# Patient Record
Sex: Female | Born: 1990 | Race: White | Hispanic: No | Marital: Married | Smoking: Former smoker
Health system: Southern US, Community
[De-identification: ages and names within clinical notes are randomized; demographics above are authoritative.]

## PROBLEM LIST (undated history)

## (undated) ENCOUNTER — Inpatient Hospital Stay: Payer: Self-pay

## (undated) DIAGNOSIS — F32A Depression, unspecified: Secondary | ICD-10-CM

## (undated) DIAGNOSIS — F329 Major depressive disorder, single episode, unspecified: Secondary | ICD-10-CM

## (undated) DIAGNOSIS — O24419 Gestational diabetes mellitus in pregnancy, unspecified control: Secondary | ICD-10-CM

## (undated) HISTORY — DX: Depression, unspecified: F32.A

## (undated) HISTORY — PX: FOOT SURGERY: SHX648

## (undated) HISTORY — DX: Gestational diabetes mellitus in pregnancy, unspecified control: O24.419

## (undated) HISTORY — DX: Major depressive disorder, single episode, unspecified: F32.9

---
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
## 2012-06-28 ENCOUNTER — Ambulatory Visit: Payer: Self-pay | Admitting: Internal Medicine

## 2013-02-16 ENCOUNTER — Emergency Department: Payer: Self-pay | Admitting: Emergency Medicine

## 2013-02-16 LAB — COMPREHENSIVE METABOLIC PANEL
Alkaline Phosphatase: 81 U/L (ref 50–136)
Anion Gap: 3 — ABNORMAL LOW (ref 7–16)
BUN: 16 mg/dL (ref 7–18)
Bilirubin,Total: 0.6 mg/dL (ref 0.2–1.0)
Co2: 26 mmol/L (ref 21–32)
Creatinine: 0.95 mg/dL (ref 0.60–1.30)
EGFR (African American): >60
EGFR (Non-African Amer.): >60
Glucose: 71 mg/dL (ref 65–99)
SGPT (ALT): 22 U/L (ref 12–78)
Sodium: 135 mmol/L — ABNORMAL LOW (ref 136–145)
Total Protein: 8.2 g/dL (ref 6.4–8.2)

## 2013-02-16 LAB — URINALYSIS, COMPLETE
Bacteria: NONE SEEN
Bilirubin,UR: NEGATIVE
Glucose,UR: NEGATIVE mg/dL (ref 0–75)
Ph: 5 (ref 4.5–8.0)
Protein: NEGATIVE
RBC,UR: <1 /HPF (ref 0–5)
Squamous Epithelial: 1
WBC UR: <1 /HPF (ref 0–5)

## 2013-02-16 LAB — CBC WITH DIFFERENTIAL/PLATELET
Basophil #: 0.1 10*3/uL (ref 0.0–0.1)
HGB: 12.9 g/dL (ref 12.0–16.0)
Lymphocyte #: 1.7 10*3/uL (ref 1.0–3.6)
Lymphocyte %: 27.3 %
MCH: 26.9 pg (ref 26.0–34.0)
MCV: 83 fL (ref 80–100)
Neutrophil %: 63.2 %
Platelet: 236 10*3/uL (ref 150–440)
RBC: 4.8 10*6/uL (ref 3.80–5.20)
WBC: 6.2 10*3/uL (ref 3.6–11.0)

## 2013-02-16 LAB — HCG, QUANTITATIVE, PREGNANCY: Beta Hcg, Quant.: 526 m[IU]/mL — ABNORMAL HIGH

## 2013-08-04 ENCOUNTER — Observation Stay: Payer: Self-pay | Admitting: Obstetrics and Gynecology

## 2013-08-04 LAB — URINALYSIS, COMPLETE
BLOOD: NEGATIVE
Bilirubin,UR: NEGATIVE
GLUCOSE, UR: NEGATIVE mg/dL (ref 0–75)
Leukocyte Esterase: NEGATIVE
NITRITE: NEGATIVE
PROTEIN: NEGATIVE
Ph: 5 (ref 4.5–8.0)
Specific Gravity: 1.01 (ref 1.003–1.030)
Squamous Epithelial: <1

## 2013-08-04 LAB — FETAL FIBRONECTIN
Appearance: NORMAL
FETAL FIBRONECTIN: NEGATIVE

## 2013-08-04 LAB — RUPTURE OF MEMBRANE PLUS: ROM PLUS: NOT DETECTED

## 2013-09-16 ENCOUNTER — Ambulatory Visit: Payer: Self-pay | Admitting: Obstetrics and Gynecology

## 2013-09-28 ENCOUNTER — Ambulatory Visit: Payer: Self-pay | Admitting: Obstetrics and Gynecology

## 2013-10-11 ENCOUNTER — Observation Stay: Payer: Self-pay

## 2013-10-17 ENCOUNTER — Observation Stay: Payer: Self-pay | Admitting: Obstetrics and Gynecology

## 2013-10-17 LAB — PIH PROFILE
AST: 19 U/L (ref 15–37)
Anion Gap: 7 (ref 7–16)
BUN: 6 mg/dL — ABNORMAL LOW (ref 7–18)
CALCIUM: 8.7 mg/dL (ref 8.5–10.1)
Chloride: 107 mmol/L (ref 98–107)
Co2: 25 mmol/L (ref 21–32)
Creatinine: 0.83 mg/dL (ref 0.60–1.30)
EGFR (African American): >60
EGFR (Non-African Amer.): >60
GLUCOSE: 65 mg/dL (ref 65–99)
HCT: 36 % (ref 35.0–47.0)
HGB: 11.7 g/dL — ABNORMAL LOW (ref 12.0–16.0)
MCH: 28.5 pg (ref 26.0–34.0)
MCHC: 32.4 g/dL (ref 32.0–36.0)
MCV: 88 fL (ref 80–100)
OSMOLALITY: 273 (ref 275–301)
Platelet: 227 10*3/uL (ref 150–440)
Potassium: 4.1 mmol/L (ref 3.5–5.1)
RBC: 4.08 10*6/uL (ref 3.80–5.20)
RDW: 13 % (ref 11.5–14.5)
SODIUM: 139 mmol/L (ref 136–145)
Uric Acid: 5.3 mg/dL (ref 2.6–6.0)
WBC: 8.6 10*3/uL (ref 3.6–11.0)

## 2013-10-17 LAB — PROTEIN / CREATININE RATIO, URINE
CREATININE, URINE: 74.6 mg/dL (ref 30.0–125.0)
PROTEIN, RANDOM URINE: 7 mg/dL (ref 0–12)
PROTEIN/CREAT. RATIO: 94 mg/g{creat} (ref 0–200)

## 2013-10-25 ENCOUNTER — Inpatient Hospital Stay: Payer: Self-pay

## 2013-10-25 LAB — CBC WITH DIFFERENTIAL/PLATELET
Basophil #: 0 10*3/uL (ref 0.0–0.1)
Basophil %: 0.3 %
Eosinophil #: 0 10*3/uL (ref 0.0–0.7)
Eosinophil %: 0.5 %
HCT: 34.3 % — ABNORMAL LOW (ref 35.0–47.0)
HGB: 11.5 g/dL — ABNORMAL LOW (ref 12.0–16.0)
LYMPHS PCT: 13.1 %
Lymphocyte #: 1.1 10*3/uL (ref 1.0–3.6)
MCH: 29.1 pg (ref 26.0–34.0)
MCHC: 33.6 g/dL (ref 32.0–36.0)
MCV: 86 fL (ref 80–100)
Monocyte #: 0.6 x10 3/mm (ref 0.2–0.9)
Monocyte %: 6.9 %
NEUTROS PCT: 79.2 %
Neutrophil #: 6.6 10*3/uL — ABNORMAL HIGH (ref 1.4–6.5)
Platelet: 216 10*3/uL (ref 150–440)
RBC: 3.96 10*6/uL (ref 3.80–5.20)
RDW: 13.2 % (ref 11.5–14.5)
WBC: 8.3 10*3/uL (ref 3.6–11.0)

## 2013-10-26 LAB — GC/CHLAMYDIA PROBE AMP

## 2013-10-27 LAB — HEMATOCRIT: HCT: 26.1 % — ABNORMAL LOW (ref 35.0–47.0)

## 2014-05-04 LAB — HM PAP SMEAR: HM Pap smear: NEGATIVE

## 2014-08-08 NOTE — H&P (Signed)
L&D Evaluation:  History:  HPI 24yo SWF sent over from office for IOL at 40w 0d, G2P010 pregnancy complicated by PTL at 28 weeks, and GDM diet controlled.   Presents with contractions   Patient's Medical History No Chronic Illness   Patient's Surgical History none   Medications Pre Natal Vitamins  Tylenol (Acetaminophen)   Allergies NKDA   Social History none   Family History Non-Contributory   ROS:  ROS All systems were reviewed.  HEENT, CNS, GI, GU, Respiratory, CV, Renal and Musculoskeletal systems were found to be normal.   Exam:  Vital Signs stable   Urine Protein negative dipstick   General no apparent distress   Mental Status clear   Chest clear   Heart normal sinus rhythm   Abdomen gravid, non-tender   Estimated Fetal Weight Average for gestational age   Fetal Position vtx   Pelvic 4/80/-1   Mebranes Ruptured, AROM now   Description clear   FHT normal rate with no decels   Fetal Heart Rate 126   Ucx irregular   Ucx Frequency 10 min   Length of each Contraction 50 seconds   Ucx Pain Scale 2   Impression:  Impression IOL secondary to GDM   Plan:  Plan AROM, possible pitocin augmentation; epidural   Electronic Signatures: Yolanda BonineBurr, Hadasa Gasner N (CNM)  (Signed 28-Jul-15 18:00)  Authored: L&D Evaluation   Last Updated: 28-Jul-15 18:00 by Ulyses AmorBurr, Cote Mayabb N (CNM)

## 2014-08-08 NOTE — H&P (Signed)
L&D Evaluation:  History Expanded:  HPI 24 yo G2P0010 at 28 weeks by LMP verified by early US, who presents with decreased fetal movement has not felt baby since yesterday and has leaking of fluid and a discharge that she hs\as called the offie about and they told her it was probably urine. she swears she is not peeing on herself so she comes in today to be evaluated. She had a SCH early on FOB of the baby has a brother wth Trisomy 4 and 14 and mental retardationa nd cleft palate, SHE is O neg/VI/RNI. She got rhogam at her early visit due to bleeding.   Gravida 2   Term 0   PreTerm 0   Abortion 1   Living o   Blood Type (Maternal) O negative   Group B Strep Results Maternal (Result >5wks must be treated as unknown) unknown/result > 5 weeks ago   Maternal HIV Negative   Maternal Syphilis Ab Nonreactive   Maternal Varicella Immune   Rubella Results (Maternal) nonimmune   Maternal [facility]Dap [facility]   Presents with decreased fetal movement, leaking fluid   Patient's Medical History No Chronic Illness    Patient's Surgical History none    Medications Pre Natal Vitamins    Allergies NKDA   Social History none    Family History see hpi    ROS:  ROS All systems were reviewed.  HEENT, CNS, GI, GU, Respiratory, CV, Renal and Musculoskeletal systems were found to be normal.   Exam:  Vital Signs stable    Urine Protein trace ketones and ph 5.0   General no apparent distress   Mental Status clear    Chest clear    Heart normal sinus rhythm   Abdomen gravid, non-tender   Back no CVAT   Edema no edema    Reflexes 1+    Pelvic no external lesions, posterior/soft shortened feeling FT, fetal head descended to LUS   Mebranes Intact   FHT normal rate with no decels, reassuring for 28 weeks.   Ucx has some runs of them irregular not painful   Skin dry   Lymph no lymphadenopathy    Impression:  Impression dehydration, decreased fetal  movement, vaginal dishcarge BV   Plan:  Plan UA, EFM/NST, monitor contractions and for cervical change, wety prep   Comments wet prep done BV loads of gardnerella will treat with flagyl 500mg  po bid   Follow Up Appointment in 1 week. alsom schedule US to check cervical length on fri at 11am at [facility],.   Electronic Signatures: Adria DevonKlett, Shahzad Thomann (MD)  (Signed 07-May-15 16:59)  Authored: L&D Evaluation   Last Updated: 07-May-15 16:59 by Adria DevonKlett, Alis Sawchuk (MD)

## 2015-05-07 ENCOUNTER — Encounter: Payer: Self-pay | Admitting: *Deleted

## 2015-05-10 ENCOUNTER — Encounter: Payer: Self-pay | Admitting: Obstetrics and Gynecology

## 2015-05-23 ENCOUNTER — Encounter: Payer: Self-pay | Admitting: Obstetrics and Gynecology

## 2015-05-23 ENCOUNTER — Ambulatory Visit (INDEPENDENT_AMBULATORY_CARE_PROVIDER_SITE_OTHER): Payer: BC Managed Care – PPO | Admitting: Obstetrics and Gynecology

## 2015-05-23 VITALS — BP 103/81 | HR 80 | Ht 68.0 in | Wt 147.1 lb

## 2015-05-23 DIAGNOSIS — N921 Excessive and frequent menstruation with irregular cycle: Secondary | ICD-10-CM | POA: Diagnosis not present

## 2015-05-23 DIAGNOSIS — Z975 Presence of (intrauterine) contraceptive device: Secondary | ICD-10-CM

## 2015-05-23 DIAGNOSIS — R4586 Emotional lability: Secondary | ICD-10-CM

## 2015-05-23 DIAGNOSIS — Z3046 Encounter for surveillance of implantable subdermal contraceptive: Secondary | ICD-10-CM | POA: Diagnosis not present

## 2015-05-23 DIAGNOSIS — F39 Unspecified mood [affective] disorder: Secondary | ICD-10-CM | POA: Diagnosis not present

## 2015-05-23 NOTE — Progress Notes (Signed)
Jasmine Edwards is a 25 y.o. year old Caucasian female here for Implanon removal.  Patient given informed consent for removal of her Implanon DUE TO IRREGULAR BLEEDING, WEIGHT GAIN AND INCREASED MOOD SWINGS.  BP 103/81 mmHg  Pulse 80  Ht  (1.727 m)  Wt 147 lb 1.6 oz (66.724 kg)  BMI 22.37 kg/m2  LMP 01/30/2015  Breastfeeding? No  Appropriate time out taken. Implanon site identified.  Area prepped in usual sterile fashon. One cc of 2% lidocaine was used to anesthetize the area at the distal end of the implant. A small stab incision was made right beside the implant on the distal portion.  The Implanon rod was grasped using hemostats and removed without difficulty.  There was less than 3 cc blood loss. There were no complications.  Steri-strips were applied over the small incision and a pressure bandage was applied.  The patient tolerated the procedure well.  She was instructed to keep the area clean and dry, remove pressure bandage in 24 hours, and keep insertion site covered with the steri-strip for 3-5 days.    Will consider Paragard-info given to review.  Follow-up PRN problems.  Hugh Garrow N Ignacio Lowder,CNM

## 2015-06-20 ENCOUNTER — Encounter: Payer: Self-pay | Admitting: Obstetrics and Gynecology

## 2015-08-01 ENCOUNTER — Encounter: Payer: Self-pay | Admitting: Obstetrics and Gynecology

## 2016-08-08 ENCOUNTER — Other Ambulatory Visit: Payer: Self-pay | Admitting: Obstetrics and Gynecology

## 2016-08-08 ENCOUNTER — Encounter: Payer: Self-pay | Admitting: Obstetrics and Gynecology

## 2016-08-08 ENCOUNTER — Ambulatory Visit (INDEPENDENT_AMBULATORY_CARE_PROVIDER_SITE_OTHER): Payer: BC Managed Care – PPO | Admitting: Obstetrics and Gynecology

## 2016-08-08 VITALS — BP 106/65 | HR 62 | Ht 67.0 in | Wt 140.1 lb

## 2016-08-08 DIAGNOSIS — Z01419 Encounter for gynecological examination (general) (routine) without abnormal findings: Secondary | ICD-10-CM

## 2016-08-08 MED ORDER — FLUOXETINE HCL 10 MG PO CAPS: 10.0000 mg | ORAL_CAPSULE | Freq: Every day | ORAL | 3 refills | Status: DC

## 2016-08-08 NOTE — Patient Instructions (Signed)
Generalized Anxiety Disorder, Adult Generalized anxiety disorder (GAD) is a mental health disorder. People with this condition constantly worry about everyday events. Unlike normal anxiety, worry related to GAD is not triggered by a specific event. These worries also do not fade or get better with time. GAD interferes with life functions, including relationships, work, and school. GAD can vary from mild to severe. People with severe GAD can have intense waves of anxiety with physical symptoms (panic attacks). What are the causes? The exact cause of GAD is not known. What increases the risk? This condition is more likely to develop in:  Women.  People who have a family history of anxiety disorders.  People who are very shy.  People who experience very stressful life events, such as the death of a loved one.  People who have a very stressful family environment. What are the signs or symptoms? People with GAD often worry excessively about many things in their lives, such as their health and family. They may also be overly concerned about:  Doing well at work.  Being on time.  Natural disasters.  Friendships. Physical symptoms of GAD include:  Fatigue.  Muscle tension or having muscle twitches.  Trembling or feeling shaky.  Being easily startled.  Feeling like your heart is pounding or racing.  Feeling out of breath or like you cannot take a deep breath.  Having trouble falling asleep or staying asleep.  Sweating.  Nausea, diarrhea, or irritable bowel syndrome (IBS).  Headaches.  Trouble concentrating or remembering facts.  Restlessness.  Irritability. How is this diagnosed? Your health care provider can diagnose GAD based on your symptoms and medical history. You will also have a physical exam. The health care provider will ask specific questions about your symptoms, including how severe they are, when they started, and if they come and go. Your health care  provider may ask you about your use of alcohol or drugs, including prescription medicines. Your health care provider may refer you to a mental health specialist for further evaluation. Your health care provider will do a thorough examination and may perform additional tests to rule out other possible causes of your symptoms. To be diagnosed with GAD, a person must have anxiety that:  Is out of his or her control.  Affects several different aspects of his or her life, such as work and relationships.  Causes distress that makes him or her unable to take part in normal activities.  Includes at least three physical symptoms of GAD, such as restlessness, fatigue, trouble concentrating, irritability, muscle tension, or sleep problems. Before your health care provider can confirm a diagnosis of GAD, these symptoms must be present more days than they are not, and they must last for six months or longer. How is this treated? The following therapies are usually used to treat GAD:  Medicine. Antidepressant medicine is usually prescribed for long-term daily control. Antianxiety medicines may be added in severe cases, especially when panic attacks occur.  Talk therapy (psychotherapy). Certain types of talk therapy can be helpful in treating GAD by providing support, education, and guidance. Options include:  Cognitive behavioral therapy (CBT). People learn coping skills and techniques to ease their anxiety. They learn to identify unrealistic or negative thoughts and behaviors and to replace them with positive ones.  Acceptance and commitment therapy (ACT). This treatment teaches people how to be mindful as a way to cope with unwanted thoughts and feelings.  Biofeedback. This process trains you to manage your body's response (  physiological response) through breathing techniques and relaxation methods. You will work with a therapist while machines are used to monitor your physical symptoms.  Stress  management techniques. These include yoga, meditation, and exercise. A mental health specialist can help determine which treatment is best for you. Some people see improvement with one type of therapy. However, other people require a combination of therapies. Follow these instructions at home:  Take over-the-counter and prescription medicines only as told by your health care provider.  Try to maintain a normal routine.  Try to anticipate stressful situations and allow extra time to manage them.  Practice any stress management or self-calming techniques as taught by your health care provider.  Do not punish yourself for setbacks or for not making progress.  Try to recognize your accomplishments, even if they are small.  Keep all follow-up visits as told by your health care provider. This is important. Contact a health care provider if:  Your symptoms do not get better.  Your symptoms get worse.  You have signs of depression, such as:  A persistently sad, cranky, or irritable mood.  Loss of enjoyment in activities that used to bring you joy.  Change in weight or eating.  Changes in sleeping habits.  Avoiding friends or family members.  Loss of energy for normal tasks.  Feelings of guilt or worthlessness. Get help right away if:  You have serious thoughts about hurting yourself or others. If you ever feel like you may hurt yourself or others, or have thoughts about taking your own life, get help right away. You can go to your nearest emergency department or call:  Your local emergency services (911 in the [place]  A suicide crisis helpline, such as the National Suicide Prevention Lifeline at 1-800-273-8255. This is open 24 hours a day. Summary  Generalized anxiety disorder (GAD) is a mental health disorder that involves worry that is not triggered by a specific event.  People with GAD often worry excessively about many things in their lives, such as their health and  family.  GAD may cause physical symptoms such as restlessness, trouble concentrating, sleep problems, frequent sweating, nausea, diarrhea, headaches, and trembling or muscle twitching.  A mental health specialist can help determine which treatment is best for you. Some people see improvement with one type of therapy. However, other people require a combination of therapies. This information is not intended to replace advice given to you by your health care provider. Make sure you discuss any questions you have with your health care provider. Document Released: 07/12/2012 Document Revised: 02/05/2016 Document Reviewed: 02/05/2016 Elsevier Interactive Patient Education  2017 [employer]  

## 2016-08-08 NOTE — Progress Notes (Signed)
   Subjective:     Jasmine Edwards is a 26 y.o. female single caucasion, [place] and is here for a comprehensive physical exam. The patient reports problems - anxiety worse since stopping paxil and desires a different medication..  Social History   Social History  . Marital status: Single    Spouse name: N/A  . Number of children: N/A  . Years of education: N/A   Occupational History  . Not on file.   Social History Main Topics  . Smoking status: Former Games developermoker  . Smokeless tobacco: Never Used  . Alcohol use No  . Drug use: No  . Sexual activity: Yes    Birth control/ protection: Implant   Other Topics Concern  . Not on file   Social History Narrative  . No narrative on file   Health Maintenance  Topic Date Due  . HIV Screening  10/07/2007  . TETANUS/TDAP  10/07/2011  . INFLUENZA VACCINE  10/29/2016  . PAP SMEAR  05/04/2017    The following portions of the patient's history were reviewed and updated as appropriate: allergies, current medications, past family history, past medical history, past social history, past surgical history and problem list.  Review of Systems Pertinent items noted in HPI and remainder of comprehensive ROS otherwise negative.   Objective:    General appearance: alert, cooperative and appears stated age Neck: no adenopathy, no carotid bruit, no JVD, supple, symmetrical, trachea midline and thyroid not enlarged, symmetric, no tenderness/mass/nodules Lungs: clear to auscultation bilaterally Breasts: normal appearance, no masses or tenderness Heart: regular rate and rhythm, S1, S2 normal, no murmur, click, rub or gallop Abdomen: soft, non-tender; bowel sounds normal; no masses,  no organomegaly Pelvic: cervix normal in appearance, external genitalia normal, no adnexal masses or tenderness, no cervical motion tenderness, rectovaginal septum normal, uterus normal size, shape, and consistency and vagina normal without  discharge    Assessment:    Healthy female exam. anxiety     Plan:  Will try prozac 10mg  daily, patient to message me on MyChart in 5-6 weeks and let me know if medicine is working.  RTC 1 year or as needed.  Melody Aura CampsShambley, CNM   See After Visit Summary for Counseling Recommendations

## 2016-08-11 LAB — CYTOLOGY - PAP

## 2016-08-13 ENCOUNTER — Other Ambulatory Visit: Payer: Self-pay | Admitting: Obstetrics and Gynecology

## 2016-08-13 DIAGNOSIS — A749 Chlamydial infection, unspecified: Secondary | ICD-10-CM

## 2016-08-13 DIAGNOSIS — A568 Sexually transmitted chlamydial infection of other sites: Secondary | ICD-10-CM | POA: Insufficient documentation

## 2016-08-13 DIAGNOSIS — O98311 Other infections with a predominantly sexual mode of transmission complicating pregnancy, first trimester: Secondary | ICD-10-CM | POA: Insufficient documentation

## 2016-08-13 MED ORDER — AZITHROMYCIN 500 MG PO TABS: 1000.0000 mg | ORAL_TABLET | Freq: Once | ORAL | 1 refills | Status: AC

## 2016-08-15 ENCOUNTER — Encounter: Payer: Self-pay | Admitting: Obstetrics and Gynecology

## 2016-08-15 ENCOUNTER — Telehealth: Payer: Self-pay | Admitting: Obstetrics and Gynecology

## 2016-08-19 NOTE — Telephone Encounter (Signed)
Spoke with pt

## 2016-08-19 NOTE — Telephone Encounter (Signed)
-----   Message from Purcell NailsMelody N Shambley, [place] sent at 08/13/2016  5:04 PM EDT ----- Please let her know pap was negative except for chlamydia. I have sent in a prescription for one time dose of antibiotics. Her partner needs to be treated and I want her to come in a month [facility]

## 2016-08-21 ENCOUNTER — Encounter: Payer: Self-pay | Admitting: Obstetrics and Gynecology

## 2016-08-21 ENCOUNTER — Ambulatory Visit (INDEPENDENT_AMBULATORY_CARE_PROVIDER_SITE_OTHER): Payer: BC Managed Care – PPO | Admitting: Obstetrics and Gynecology

## 2016-08-21 VITALS — BP 124/83 | HR 83 | Ht 67.0 in | Wt 139.1 lb

## 2016-08-21 DIAGNOSIS — N926 Irregular menstruation, unspecified: Secondary | ICD-10-CM | POA: Diagnosis not present

## 2016-08-21 LAB — POCT URINE PREGNANCY: Preg Test, Ur: POSITIVE — AB

## 2016-08-21 NOTE — Patient Instructions (Signed)
First Trimester of Pregnancy The first trimester of pregnancy is from week 1 until the end of week 13 (months 1 through 3). A week after a sperm fertilizes an egg, the egg will implant on the wall of the uterus. This embryo will begin to develop into a baby. Genes from you and your partner will form the baby. The female genes will determine whether the baby will be a boy or a girl. At 6-8 weeks, the eyes and face will be formed, and the heartbeat can be seen on ultrasound. At the end of 12 weeks, all the baby's organs will be formed. Now that you are pregnant, you will want to do everything you can to have a healthy baby. Two of the most important things are to get good prenatal care and to follow your health care provider's instructions. Prenatal care is all the medical care you receive before the baby's birth. This care will help prevent, find, and treat any problems during the pregnancy and childbirth. Body changes during your first trimester Your body goes through many changes during pregnancy. The changes vary from woman to woman.  You may gain or lose a couple of pounds at first.  You may feel sick to your stomach (nauseous) and you may throw up (vomit). If the vomiting is uncontrollable, call your health care provider.  You may tire easily.  You may develop headaches that can be relieved by medicines. All medicines should be approved by your health care provider.  You may urinate more often. Painful urination may mean you have a bladder infection.  You may develop heartburn as a result of your pregnancy.  You may develop constipation because certain hormones are causing the muscles that push stool through your intestines to slow down.  You may develop hemorrhoids or swollen veins (varicose veins).  Your breasts may begin to grow larger and become tender. Your nipples may stick out more, and the tissue that surrounds them (areola) may become darker.  Your gums may bleed and may be  sensitive to brushing and flossing.  Dark spots or blotches (chloasma, mask of pregnancy) may develop on your face. This will likely fade after the baby is born.  Your menstrual periods will stop.  You may have a loss of appetite.  You may develop cravings for certain kinds of food.  You may have changes in your emotions from day to day, such as being excited to be pregnant or being concerned that something may go wrong with the pregnancy and baby.  You may have more vivid and strange dreams.  You may have changes in your hair. These can include thickening of your hair, rapid growth, and changes in texture. Some women also have hair loss during or after pregnancy, or hair that feels dry or thin. Your hair will most likely return to normal after your baby is born.  What to expect at prenatal visits During a routine prenatal visit:  You will be weighed to make sure you and the baby are growing normally.  Your blood pressure will be taken.  Your abdomen will be measured to track your baby's growth.  The fetal heartbeat will be listened to between weeks 10 and 14 of your pregnancy.  Test results from any previous visits will be discussed.  Your health care provider may ask you:  How you are feeling.  If you are feeling the baby move.  If you have had any abnormal symptoms, such as leaking fluid, bleeding, severe headaches,   or abdominal cramping.  If you are using any tobacco products, including cigarettes, chewing tobacco, and electronic cigarettes.  If you have any questions.  Other tests that may be performed during your first trimester include:  Blood tests to find your blood type and to check for the presence of any previous infections. The tests will also be used to check for low iron levels (anemia) and protein on red blood cells (Rh antibodies). Depending on your risk factors, or if you previously had diabetes during pregnancy, you may have tests to check for high blood  sugar that affects pregnant women (gestational diabetes).  Urine tests to check for infections, diabetes, or protein in the urine.  An ultrasound to confirm the proper growth and development of the baby.  Fetal screens for spinal cord problems (spina bifida) and Down syndrome.  HIV (human immunodeficiency virus) testing. Routine prenatal testing includes screening for HIV, unless you choose not to have this test.  You may need other tests to make sure you and the baby are doing well.  Follow these instructions at home: Medicines  Follow your health care provider's instructions regarding medicine use. Specific medicines may be either safe or unsafe to take during pregnancy.  Take a prenatal vitamin that contains at least 600 micrograms (mcg) of folic acid.  If you develop constipation, try taking a stool softener if your health care provider approves. Eating and drinking  Eat a balanced diet that includes fresh fruits and vegetables, whole grains, good sources of protein such as meat, eggs, or tofu, and low-fat dairy. Your health care provider will help you determine the amount of weight gain that is right for you.  Avoid raw meat and uncooked cheese. These carry germs that can cause birth defects in the baby.  Eating four or five small meals rather than three large meals a day may help relieve nausea and vomiting. If you start to feel nauseous, eating a few soda crackers can be helpful. Drinking liquids between meals, instead of during meals, also seems to help ease nausea and vomiting.  Limit foods that are high in fat and processed sugars, such as fried and sweet foods.  To prevent constipation: ? Eat foods that are high in fiber, such as fresh fruits and vegetables, whole grains, and beans. ? Drink enough fluid to keep your urine clear or pale yellow. Activity  Exercise only as directed by your health care provider. Most women can continue their usual exercise routine during  pregnancy. Try to exercise for 30 minutes at least 5 days a week. Exercising will help you: ? Control your weight. ? Stay in shape. ? Be prepared for labor and delivery.  Experiencing pain or cramping in the lower abdomen or lower back is a good sign that you should stop exercising. Check with your health care provider before continuing with normal exercises.  Try to avoid standing for long periods of time. Move your legs often if you must stand in one place for a long time.  Avoid heavy lifting.  Wear low-heeled shoes and practice good posture.  You may continue to have sex unless your health care provider tells you not to. Relieving pain and discomfort  Wear a good support bra to relieve breast tenderness.  Take warm sitz baths to soothe any pain or discomfort caused by hemorrhoids. Use hemorrhoid cream if your health care provider approves.  Rest with your legs elevated if you have leg cramps or low back pain.  If you develop   varicose veins in your legs, wear support hose. Elevate your feet for 15 minutes, 3-4 times a day. Limit salt in your diet. Prenatal care  Schedule your prenatal visits by the twelfth week of pregnancy. They are usually scheduled monthly at first, then more often in the last 2 months before delivery.  Write down your questions. Take them to your prenatal visits.  Keep all your prenatal visits as told by your health care provider. This is important. Safety  Wear your seat belt at all times when driving.  Make a list of emergency phone numbers, including numbers for family, friends, the hospital, and police and fire departments. General instructions  Ask your health care provider for a referral to a local prenatal education class. Begin classes no later than the beginning of month 6 of your pregnancy.  Ask for help if you have counseling or nutritional needs during pregnancy. Your health care provider can offer advice or refer you to specialists for help  with various needs.  Do not use hot tubs, steam rooms, or saunas.  Do not douche or use tampons or scented sanitary pads.  Do not cross your legs for long periods of time.  Avoid cat litter boxes and soil used by cats. These carry germs that can cause birth defects in the baby and possibly loss of the fetus by miscarriage or stillbirth.  Avoid all smoking, herbs, alcohol, and medicines not prescribed by your health care provider. Chemicals in these products affect the formation and growth of the baby.  Do not use any products that contain nicotine or tobacco, such as cigarettes and e-cigarettes. If you need help quitting, ask your health care provider. You may receive counseling support and other resources to help you quit.  Schedule a dentist appointment. At home, brush your teeth with a soft toothbrush and be gentle when you floss. Contact a health care provider if:  You have dizziness.  You have mild pelvic cramps, pelvic pressure, or nagging pain in the abdominal area.  You have persistent nausea, vomiting, or diarrhea.  You have a bad smelling vaginal discharge.  You have pain when you urinate.  You notice increased swelling in your face, hands, legs, or ankles.  You are exposed to fifth disease or chickenpox.  You are exposed to German measles (rubella) and have never had it. Get help right away if:  You have a fever.  You are leaking fluid from your vagina.  You have spotting or bleeding from your vagina.  You have severe abdominal cramping or pain.  You have rapid weight gain or loss.  You vomit blood or material that looks like coffee grounds.  You develop a severe headache.  You have shortness of breath.  You have any kind of trauma, such as from a fall or a car accident. Summary  The first trimester of pregnancy is from week 1 until the end of week 13 (months 1 through 3).  Your body goes through many changes during pregnancy. The changes vary from  woman to woman.  You will have routine prenatal visits. During those visits, your health care provider will examine you, discuss any test results you may have, and talk with you about how you are feeling. This information is not intended to replace advice given to you by your health care provider. Make sure you discuss any questions you have with your health care provider. Document Released: 03/11/2001 Document Revised: 02/27/2016 Document Reviewed: 02/27/2016 [employer] Interactive Patient Education  2017 [employer]  

## 2016-08-21 NOTE — Progress Notes (Signed)
Subjective:     Patient ID: Jasmine Edwards, female   DOB: 04/17/1990, 26 y.o.   MRN: [id number]  HPI LMP 07/19/16 GIVING EDC 04/25/17 and EGA [id number] + home UPT earlier this week. Just finished medicine for chlamydia, will need test of cure. Does have mild cramping.  Review of Systems Negative except stated above in HPI    Objective:   Physical Exam A&O x4 Well groomed female in no distress Blood pressure 124/83, pulse 83, height 5\' 7"  (1.702 m), weight 139 lb 1.6 oz (63.1 kg), last menstrual period 07/19/2016.    Assessment:     Missed menses    Plan:     RTC in 2 weeks for viability scan and TOC, New OB labs    already started a PNV  Cherilyn Sautter [name], CNM

## 2016-09-03 ENCOUNTER — Ambulatory Visit (INDEPENDENT_AMBULATORY_CARE_PROVIDER_SITE_OTHER): Payer: BC Managed Care – PPO

## 2016-09-03 DIAGNOSIS — N926 Irregular menstruation, unspecified: Secondary | ICD-10-CM | POA: Diagnosis not present

## 2016-09-04 ENCOUNTER — Other Ambulatory Visit: Payer: BC Managed Care – PPO

## 2016-09-11 ENCOUNTER — Encounter: Payer: BC Managed Care – PPO | Admitting: Obstetrics and Gynecology

## 2016-09-19 ENCOUNTER — Ambulatory Visit (INDEPENDENT_AMBULATORY_CARE_PROVIDER_SITE_OTHER): Payer: BC Managed Care – PPO | Admitting: Obstetrics and Gynecology

## 2016-09-19 ENCOUNTER — Other Ambulatory Visit: Payer: Self-pay | Admitting: Obstetrics and Gynecology

## 2016-09-19 ENCOUNTER — Encounter: Payer: Self-pay | Admitting: Obstetrics and Gynecology

## 2016-09-19 VITALS — BP 112/69 | HR 82 | Ht 67.0 in | Wt 136.7 lb

## 2016-09-19 VITALS — BP 112/69 | HR 82 | Wt 136.0 lb

## 2016-09-19 DIAGNOSIS — J309 Allergic rhinitis, unspecified: Secondary | ICD-10-CM | POA: Insufficient documentation

## 2016-09-19 DIAGNOSIS — Z113 Encounter for screening for infections with a predominantly sexual mode of transmission: Secondary | ICD-10-CM

## 2016-09-19 DIAGNOSIS — A749 Chlamydial infection, unspecified: Secondary | ICD-10-CM

## 2016-09-19 DIAGNOSIS — Z1389 Encounter for screening for other disorder: Secondary | ICD-10-CM

## 2016-09-19 DIAGNOSIS — Z3481 Encounter for supervision of other normal pregnancy, first trimester: Secondary | ICD-10-CM

## 2016-09-19 NOTE — Patient Instructions (Addendum)
Pregnancy and Zika Virus Disease Zika virus disease, or Zika, is an illness that can spread to people from mosquitoes that carry the virus. It may also spread from person to person through infected body fluids. Zika first occurred in [place], but recently it has spread to new areas. The virus occurs in tropical climates. The location of Zika continues to change. Most people who become infected with Zika virus do not develop serious illness. However, Zika may cause birth defects in an unborn baby whose mother is infected with the virus. It may also increase the risk of miscarriage. What are the symptoms of Zika virus disease? In many cases, people who have been infected with Zika virus do not develop any symptoms. If symptoms appear, they usually start about a week after the person is infected. Symptoms are usually mild. They may include:  Fever.  Rash.  Red eyes.  Joint pain.  How does Zika virus disease spread? The main way that Zika virus spreads is through the bite of a certain type of mosquito. Unlike most types of mosquitos, which bite only at night, the type of mosquito that carries Zika virus bites both at night and during the day. Zika virus can also spread through sexual contact, through a blood transfusion, and from a mother to her baby before or during birth. Once you have had Zika virus disease, it is unlikely that you will get it again. Can I pass Zika to my baby during pregnancy? Yes, Zika can pass from a mother to her baby before or during birth. What problems can Zika cause for my baby? A woman who is infected with Zika virus while pregnant is at risk of having her baby born with a condition in which the brain or head is smaller than expected (microcephaly). Babies who have microcephaly can have developmental delays, seizures, hearing problems, and vision problems. Having Zika virus disease during pregnancy can also increase the risk of miscarriage. How can Zika virus disease be  prevented? There is no vaccine to prevent Zika. The best way to prevent the disease is to avoid infected mosquitoes and avoid exposure to body fluids that can spread the virus. Avoid any possible exposure to Zika by taking the following precautions. For women and their sex partners:  Avoid traveling to high-risk areas. The locations where Zika is being reported change often. To identify high-risk areas, check the CDC travel website: www.cdc.gov/zika/geo/index.html  If you or your sex partner must travel to a high-risk area, talk with a health care provider before and after traveling.  Take all precautions to avoid mosquito bites if you live in, or travel to, any of the high-risk areas. Insect repellents are safe to use during pregnancy.  Ask your health care provider when it is safe to have sexual contact.  For women:  If you are pregnant or trying to become pregnant, avoid sexual contact with persons who may have been exposed to Zika virus, persons who have possible symptoms of Zika, or persons whose history you are unsure about. If you choose to have sexual contact with someone who may have been exposed to Zika virus, use condoms correctly during the entire duration of sexual activity, every time. Do not share sexual devices, as you may be exposed to body fluids.  Ask your health care provider about when it is safe to attempt pregnancy after a possible exposure to Zika virus.  What steps should I take to avoid mosquito bites? Take these steps to avoid mosquito bites   when you are in a high-risk area:  Wear loose clothing that covers your arms and legs.  Limit your outdoor activities.  Do not open windows unless they have window screens.  Sleep under mosquito nets.  Use insect repellent. The best insect repellents have:  DEET, picaridin, oil of lemon eucalyptus (OLE), or IR3535 in them.  Higher amounts of an active ingredient in them.  Remember that insect repellents are safe to  use during pregnancy.  Do not use OLE on children who are younger than 3 years of age. Do not use insect repellent on babies who are younger than 2 months of age.  Cover your child's stroller with mosquito netting. Make sure the netting fits snugly and that any loose netting does not cover your child's mouth or nose. Do not use a blanket as a mosquito-protection cover.  Do not apply insect repellent underneath clothing.  If you are using sunscreen, apply the sunscreen before applying the insect repellent.  Treat clothing with permethrin. Do not apply permethrin directly to your skin. Follow label directions for safe use.  Get rid of standing water, where mosquitoes may reproduce. Standing water is often found in items such as buckets, bowls, animal food dishes, and flowerpots.  When you return from traveling to any high-risk area, continue taking actions to protect yourself against mosquito bites for 3 weeks, even if you show no signs of illness. This will prevent spreading Zika virus to uninfected mosquitoes. What should I know about the sexual transmission of Zika? People can spread Zika to their sexual partners during vaginal, anal, or oral sex, or by sharing sexual devices. Many people with Zika do not develop symptoms, so a person could spread the disease without knowing that they are infected. The greatest risk is to women who are pregnant or who may become pregnant. Zika virus can live longer in semen than it can live in blood. Couples can prevent sexual transmission of the virus by:  Using condoms correctly during the entire duration of sexual activity, every time. This includes vaginal, anal, and oral sex.  Not sharing sexual devices. Sharing increases your risk of being exposed to body fluid from another person.  Avoiding all sexual activity until your health care provider says it is safe.  Should I be tested for Zika virus? A sample of your blood can be tested for Zika virus. A  pregnant woman should be tested if she may have been exposed to the virus or if she has symptoms of Zika. She may also have additional tests done during her pregnancy, such ultrasound testing. Talk with your health care provider about which tests are recommended. This information is not intended to replace advice given to you by your health care provider. Make sure you discuss any questions you have with your health care provider. Document Released: 12/06/2014 Document Revised: 08/23/2015 Document Reviewed: 11/29/2014 Elsevier Interactive Patient Education  2018 [employer] Hyperemesis Gravidarum Hyperemesis gravidarum is a severe form of nausea and vomiting that happens during pregnancy. Hyperemesis is worse than morning sickness. It may cause you to have nausea or vomiting all day for many days. It may keep you from eating and drinking enough food and liquids. Hyperemesis usually occurs during the first half (the first 20 weeks) of pregnancy. It often goes away once a woman is in her second half of pregnancy. However, sometimes hyperemesis continues through an entire pregnancy. What are the causes? The cause of this condition is not known. It may be related   to changes in chemicals (hormones) in the body during pregnancy, such as the high level of pregnancy hormone (human chorionic gonadotropin) or the increase in the female sex hormone (estrogen). What are the signs or symptoms? Symptoms of this condition include:  Severe nausea and vomiting.  Nausea that does not go away.  Vomiting that does not allow you to keep any food down.  Weight loss.  Body fluid loss (dehydration).  Having no desire to eat, or not liking food that you have previously enjoyed.  How is this diagnosed? This condition may be diagnosed based on:  A physical exam.  Your medical history.  Your symptoms.  Blood tests.  Urine tests.  How is this treated? This condition may be managed with medicine. If  medicines to do not help relieve nausea and vomiting, you may need to receive fluids through an IV tube at the hospital. Follow these instructions at home:  Take over-the-counter and prescription medicines only as told by your health care provider.  Avoid iron pills and multivitamins that contain iron for the first 3-4 months of pregnancy. If you take prescription iron pills, do not stop taking them unless your health care provider approves.  Take the following actions to help prevent nausea and vomiting: ? In the morning, before getting out of bed, try eating a couple of dry crackers or a piece of toast. ? Avoid foods and smells that upset your stomach. Fatty and spicy foods may make nausea worse. ? Eat 5-6 small meals a day. ? Do not drink fluids while eating meals. Drink between meals. ? Eat or suck on things that have ginger in them. Ginger can help relieve nausea. ? Avoid food preparation. The smell of food can spoil your appetite or trigger nausea.  Follow instructions from your health care provider about eating or drinking restrictions.  For snacks, eat high-protein foods, such as cheese.  Keep all follow-up and pre-birth (prenatal) visits as told by your health care provider. This is important. Contact a health care provider if:  You have pain in your abdomen.  You have a severe headache.  You have vision problems.  You are losing weight. Get help right away if:  You cannot drink fluids without vomiting.  You vomit blood.  You have constant nausea and vomiting.  You are very weak.  You are very thirsty.  You feel dizzy.  You faint.  You have a fever or other symptoms that last for more than 2-3 days.  You have a fever and your symptoms suddenly get worse. Summary  Hyperemesis gravidarum is a severe form of nausea and vomiting that happens during pregnancy.  Making some changes to your eating habits may help relieve nausea and vomiting.  This condition may  be managed with medicine.  If medicines to do not help relieve nausea and vomiting, you may need to receive fluids through an IV tube at the hospital. This information is not intended to replace advice given to you by your health care provider. Make sure you discuss any questions you have with your health care provider. Document Released: 03/17/2005 Document Revised: 11/14/2015 Document Reviewed: 11/14/2015 Elsevier Interactive Patient Education  2017 [employer] First Trimester of Pregnancy The first trimester of pregnancy is from week 1 until the end of week 13 (months 1 through 3). During this time, your baby will begin to develop inside you. At 6-8 weeks, the eyes and face are formed, and the heartbeat can be seen on ultrasound. At the   end of 12 weeks, all the baby's organs are formed. Prenatal care is all the medical care you receive before the birth of your baby. Make sure you get good prenatal care and follow all of your doctor's instructions. Follow these instructions at home: Medicines  Take over-the-counter and prescription medicines only as told by your doctor. Some medicines are safe and some medicines are not safe during pregnancy.  Take a prenatal vitamin that contains at least 600 micrograms (mcg) of folic acid.  If you have trouble pooping (constipation), take medicine that will make your stool soft (stool softener) if your doctor approves. Eating and drinking  Eat regular, healthy meals.  Your doctor will tell you the amount of weight gain that is right for you.  Avoid raw meat and uncooked cheese.  If you feel sick to your stomach (nauseous) or throw up (vomit): ? Eat 4 or 5 small meals a day instead of 3 large meals. ? Try eating a few soda crackers. ? Drink liquids between meals instead of during meals.  To prevent constipation: ? Eat foods that are high in fiber, like fresh fruits and vegetables, whole grains, and beans. ? Drink enough fluids to keep your pee  (urine) clear or pale yellow. Activity  Exercise only as told by your doctor. Stop exercising if you have cramps or pain in your lower belly (abdomen) or low back.  Do not exercise if it is too hot, too humid, or if you are in a place of great height (high altitude).  Try to avoid standing for long periods of time. Move your legs often if you must stand in one place for a long time.  Avoid heavy lifting.  Wear low-heeled shoes. Sit and stand up straight.  You can have sex unless your doctor tells you not to. Relieving pain and discomfort  Wear a good support bra if your breasts are sore.  Take warm water baths (sitz baths) to soothe pain or discomfort caused by hemorrhoids. Use hemorrhoid cream if your doctor says it is okay.  Rest with your legs raised if you have leg cramps or low back pain.  If you have puffy, bulging veins (varicose veins) in your legs: ? Wear support hose or compression stockings as told by your doctor. ? Raise (elevate) your feet for 15 minutes, 3-4 times a day. ? Limit salt in your food. Prenatal care  Schedule your prenatal visits by the twelfth week of pregnancy.  Write down your questions. Take them to your prenatal visits.  Keep all your prenatal visits as told by your doctor. This is important. Safety  Wear your seat belt at all times when driving.  Make a list of emergency phone numbers. The list should include numbers for family, friends, the hospital, and police and fire departments. General instructions  Ask your doctor for a referral to a local prenatal class. Begin classes no later than at the start of month 6 of your pregnancy.  Ask for help if you need counseling or if you need help with nutrition. Your doctor can give you advice or tell you where to go for help.  Do not use hot tubs, steam rooms, or saunas.  Do not douche or use tampons or scented sanitary pads.  Do not cross your legs for long periods of time.  Avoid all herbs  and alcohol. Avoid drugs that are not approved by your doctor.  Do not use any tobacco products, including cigarettes, chewing tobacco, and electronic cigarettes.   If you need help quitting, ask your doctor. You may get counseling or other support to help you quit.  Avoid cat litter boxes and soil used by cats. These carry germs that can cause birth defects in the baby and can cause a loss of your baby (miscarriage) or stillbirth.  Visit your dentist. At home, brush your teeth with a soft toothbrush. Be gentle when you floss. Contact a doctor if:  You are dizzy.  You have mild cramps or pressure in your lower belly.  You have a nagging pain in your belly area.  You continue to feel sick to your stomach, you throw up, or you have watery poop (diarrhea).  You have a bad smelling fluid coming from your vagina.  You have pain when you pee (urinate).  You have increased puffiness (swelling) in your face, hands, legs, or ankles. Get help right away if:  You have a fever.  You are leaking fluid from your vagina.  You have spotting or bleeding from your vagina.  You have very bad belly cramping or pain.  You gain or lose weight rapidly.  You throw up blood. It may look like coffee grounds.  You are around people who have German measles, fifth disease, or chickenpox.  You have a very bad headache.  You have shortness of breath.  You have any kind of trauma, such as from a fall or a car accident. Summary  The first trimester of pregnancy is from week 1 until the end of week 13 (months 1 through 3).  To take care of yourself and your unborn baby, you will need to eat healthy meals, take medicines only if your doctor tells you to do so, and do activities that are safe for you and your baby.  Keep all follow-up visits as told by your doctor. This is important as your doctor will have to ensure that your baby is healthy and growing well. This information is not intended to replace  advice given to you by your health care provider. Make sure you discuss any questions you have with your health care provider. Document Released: 09/03/2007 Document Revised: 03/25/2016 Document Reviewed: 03/25/2016 Elsevier Interactive Patient Education  2017 [employer]. Commonly Asked Questions During Pregnancy  Cats: A parasite can be excreted in cat feces.  To avoid exposure you need to have another person empty the little box.  If you must empty the litter box you will need to wear gloves.  Wash your hands after handling your cat.  This parasite can also be found in raw or undercooked meat so this should also be avoided.  Colds, Sore Throats, Flu: Please check your medication sheet to see what you can take for symptoms.  If your symptoms are unrelieved by these medications please call the office.  Dental Work: Most any dental work your dentist recommends is permitted.  X-rays should only be taken during the first trimester if absolutely necessary.  Your abdomen should be shielded with a lead apron during all x-rays.  Please notify your provider prior to receiving any x-rays.  Novocaine is fine; gas is not recommended.  If your dentist requires a note from us prior to dental work please call the office and we will provide one for you.  Exercise: Exercise is an important part of staying healthy during your pregnancy.  You may continue most exercises you were accustomed to prior to pregnancy.  Later in your pregnancy you will most likely notice you have difficulty with activities   requiring balance like riding a bicycle.  It is important that you listen to your body and avoid activities that put you at a higher risk of falling.  Adequate rest and staying well hydrated are a must!  If you have questions about the safety of specific activities ask your provider.    Exposure to Children with illness: Try to avoid obvious exposure; report any symptoms to us when noted,  If you have chicken pos, red  measles or mumps, you should be immune to these diseases.   Please do not take any vaccines while pregnant unless you have checked with your OB provider.  Fetal Movement: After 28 weeks we recommend you do "kick counts" twice daily.  Lie or sit down in a calm quiet environment and count your baby movements "kicks".  You should feel your baby at least 10 times per hour.  If you have not felt 10 kicks within the first hour get up, walk around and have something sweet to eat or drink then repeat for an additional hour.  If count remains less than 10 per hour notify your provider.  Fumigating: Follow your pest control agent's advice as to how long to stay out of your home.  Ventilate the area well before re-entering.  Hemorrhoids:   Most over-the-counter preparations can be used during pregnancy.  Check your medication to see what is safe to use.  It is important to use a stool softener or fiber in your diet and to drink lots of liquids.  If hemorrhoids seem to be getting worse please call the office.   Hot Tubs:  Hot tubs Jacuzzis and saunas are not recommended while pregnant.  These increase your internal body temperature and should be avoided.  Intercourse:  Sexual intercourse is safe during pregnancy as long as you are comfortable, unless otherwise advised by your provider.  Spotting may occur after intercourse; report any bright red bleeding that is heavier than spotting.  Labor:  If you know that you are in labor, please go to the hospital.  If you are unsure, please call the office and let us help you decide what to do.  Lifting, straining, etc:  If your job requires heavy lifting or straining please check with your provider for any limitations.  Generally, you should not lift items heavier than that you can lift simply with your hands and arms (no back muscles)  Painting:  Paint fumes do not harm your pregnancy, but may make you ill and should be avoided if possible.  Latex or water based paints  have less odor than oils.  Use adequate ventilation while painting.  Permanents & Hair Color:  Chemicals in hair dyes are not recommended as they cause increase hair dryness which can increase hair loss during pregnancy.  " Highlighting" and permanents are allowed.  Dye may be absorbed differently and permanents may not hold as well during pregnancy.  Sunbathing:  Use a sunscreen, as skin burns easily during pregnancy.  Drink plenty of fluids; avoid over heating.  Tanning Beds:  Because their possible side effects are still unknown, tanning beds are not recommended.  Ultrasound Scans:  Routine ultrasounds are performed at approximately 20 weeks.  You will be able to see your baby's general anatomy an if you would like to know the gender this can usually be determined as well.  If it is questionable when you conceived you may also receive an ultrasound early in your pregnancy for dating purposes.  Otherwise ultrasound exams   are not routinely performed unless there is a medical necessity.  Although you can request a scan we ask that you pay for it when conducted because insurance does not cover " patient request" scans.  Work: If your pregnancy proceeds without complications you may work until your due date, unless your physician or employer advises otherwise.  Round Ligament Pain/Pelvic Discomfort:  Sharp, shooting pains not associated with bleeding are fairly common, usually occurring in the second trimester of pregnancy.  They tend to be worse when standing up or when you remain standing for long periods of time.  These are the result of pressure of certain pelvic ligaments called "round ligaments".  Rest, Tylenol and heat seem to be the most effective relief.  As the womb and fetus grow, they rise out of the pelvis and the discomfort improves.  Please notify the office if your pain seems different than that described.  It may represent a more serious condition.   

## 2016-09-19 NOTE — Progress Notes (Signed)
Jasmine Edwards presents for NOB nurse interview visit. Pregnancy confirmation done on 08/21/2016 by MNS, CNM. UPT: positive. LMP- 07/19/2016. Ultrasound on 09/03/2016 resulted with EDD consistent with LMP.  G-2.  P-1001. Pregnancy education material explained and given. No cats in the home. NOB labs ordered.  HIV labs and Drug screen were explained optional and she did not decline. Drug screen ordered. PNV encouraged. Genetic screening options discussed. Genetic testing: Unsure. Pt insurance is BCBS and pregnancy medicaid.  [facility] and is aware to make an appt for lab if she decides to do it. Pt may discuss with provider. It was noted on pt's pap smear done by MNS, CNM on 08/08/2016, that pap was negative, gonerrhea and trich test negative but chlamydia tested positive. Pt done her TOC today. Pt had voided prior to her visit with MNS and was unable to void again. Need to obtain UA and UC on NOB PE.  Pt. To follow up with provider in 4 weeks for NOB physical.  All questions answered.

## 2016-09-19 NOTE — Progress Notes (Signed)
I obtained vaginal swab for TOC while patient was here for Nurse intake and labs. No other issues.   Emalene Welte [place], CNM

## 2016-09-20 LAB — CBC WITH DIFFERENTIAL/PLATELET
BASOS: 0 %
Basophils Absolute: 0 10*3/uL (ref 0.0–0.2)
EOS (ABSOLUTE): 0.1 10*3/uL (ref 0.0–0.4)
Eos: 1 %
HEMOGLOBIN: 12.4 g/dL (ref 11.1–15.9)
Hematocrit: 37.3 % (ref 34.0–46.6)
IMMATURE GRANS (ABS): 0 10*3/uL (ref 0.0–0.1)
Immature Granulocytes: 0 %
LYMPHS ABS: 1.7 10*3/uL (ref 0.7–3.1)
LYMPHS: 28 %
MCH: 29.6 pg (ref 26.6–33.0)
MCHC: 33.2 g/dL (ref 31.5–35.7)
MCV: 89 fL (ref 79–97)
Monocytes Absolute: 0.4 10*3/uL (ref 0.1–0.9)
Monocytes: 7 %
Neutrophils Absolute: 4.1 10*3/uL (ref 1.4–7.0)
Neutrophils: 64 %
PLATELETS: 258 10*3/uL (ref 150–379)
RBC: 4.19 x10E6/uL (ref 3.77–5.28)
RDW: 12.7 % (ref 12.3–15.4)
WBC: 6.3 10*3/uL (ref 3.4–10.8)

## 2016-09-20 LAB — HEPATITIS B SURFACE ANTIGEN: HEP B S AG: NEGATIVE

## 2016-09-20 LAB — ANTIBODY SCREEN: ANTIBODY SCREEN: NEGATIVE

## 2016-09-20 LAB — RUBELLA SCREEN: RUBELLA: 1.26 {index} (ref >0.99)

## 2016-09-20 LAB — ABO

## 2016-09-20 LAB — HIV ANTIBODY (ROUTINE TESTING W REFLEX): HIV SCREEN 4TH GENERATION: NONREACTIVE

## 2016-09-20 LAB — RPR: RPR Ser Ql: NONREACTIVE

## 2016-09-20 LAB — RH TYPE: Rh Factor: NEGATIVE

## 2016-09-20 LAB — VARICELLA ZOSTER ANTIBODY, IGG: VARICELLA: 467 {index} (ref >165)

## 2016-09-21 NOTE — Progress Notes (Signed)
I have reviewed the record and concur with patient management and plan.  Angellynn Kimberlin, MD [facility]     

## 2016-09-22 ENCOUNTER — Other Ambulatory Visit: Payer: BC Managed Care – PPO

## 2016-09-22 DIAGNOSIS — Z3481 Encounter for supervision of other normal pregnancy, first trimester: Secondary | ICD-10-CM

## 2016-09-22 DIAGNOSIS — Z1389 Encounter for screening for other disorder: Secondary | ICD-10-CM

## 2016-09-22 DIAGNOSIS — Z1379 Encounter for other screening for genetic and chromosomal anomalies: Secondary | ICD-10-CM

## 2016-09-23 ENCOUNTER — Encounter: Payer: Self-pay | Admitting: Obstetrics and Gynecology

## 2016-09-23 LAB — URINALYSIS, ROUTINE W REFLEX MICROSCOPIC
Bilirubin, UA: NEGATIVE
Glucose, UA: NEGATIVE
Ketones, UA: NEGATIVE
Leukocytes, UA: NEGATIVE
Nitrite, UA: NEGATIVE
PH UA: 5.5 (ref 5.0–7.5)
PROTEIN UA: NEGATIVE
RBC, UA: NEGATIVE
Specific Gravity, UA: 1.014 (ref 1.005–1.030)
UUROB: 0.2 mg/dL (ref 0.2–1.0)

## 2016-09-24 LAB — URINE CULTURE, OB REFLEX

## 2016-09-24 LAB — CULTURE, OB URINE

## 2016-09-27 LAB — MATERNIT 21 PLUS CORE, BLOOD
CHROMOSOME 18: NEGATIVE
Chromosome 13: NEGATIVE
Chromosome 21: NEGATIVE
Y CHROMOSOME: NOT DETECTED

## 2016-09-30 ENCOUNTER — Encounter: Payer: Self-pay | Admitting: Obstetrics and Gynecology

## 2016-09-30 ENCOUNTER — Telehealth: Payer: Self-pay

## 2016-09-30 NOTE — Telephone Encounter (Signed)
Called pt informed her of genetic results. Pt states that she had already received a phone call from [facility], CMA this morning regarding results.

## 2016-09-30 NOTE — Telephone Encounter (Signed)
-----   Message from Hildred LaserAnika Cherry, MD sent at 09/30/2016  8:25 AM EDT ----- Please inform patient of normal MaterniT21 screen.  Is a female if she desires to know the sex.

## 2016-10-10 ENCOUNTER — Ambulatory Visit (INDEPENDENT_AMBULATORY_CARE_PROVIDER_SITE_OTHER): Payer: BC Managed Care – PPO | Admitting: Obstetrics and Gynecology

## 2016-10-10 ENCOUNTER — Encounter: Payer: Self-pay | Admitting: Obstetrics and Gynecology

## 2016-10-10 VITALS — BP 105/65 | HR 80 | Wt 139.4 lb

## 2016-10-10 DIAGNOSIS — A568 Sexually transmitted chlamydial infection of other sites: Secondary | ICD-10-CM

## 2016-10-10 DIAGNOSIS — O09299 Supervision of pregnancy with other poor reproductive or obstetric history, unspecified trimester: Secondary | ICD-10-CM

## 2016-10-10 DIAGNOSIS — O98311 Other infections with a predominantly sexual mode of transmission complicating pregnancy, first trimester: Secondary | ICD-10-CM

## 2016-10-10 DIAGNOSIS — Z3481 Encounter for supervision of other normal pregnancy, first trimester: Secondary | ICD-10-CM

## 2016-10-10 DIAGNOSIS — Z6791 Unspecified blood type, Rh negative: Secondary | ICD-10-CM | POA: Insufficient documentation

## 2016-10-10 DIAGNOSIS — Z2239 Carrier of other specified bacterial diseases: Secondary | ICD-10-CM

## 2016-10-10 DIAGNOSIS — Z8632 Personal history of gestational diabetes: Secondary | ICD-10-CM

## 2016-10-10 DIAGNOSIS — O09899 Supervision of other high risk pregnancies, unspecified trimester: Secondary | ICD-10-CM

## 2016-10-10 DIAGNOSIS — O98811 Other maternal infectious and parasitic diseases complicating pregnancy, first trimester: Secondary | ICD-10-CM

## 2016-10-10 DIAGNOSIS — A749 Chlamydial infection, unspecified: Secondary | ICD-10-CM

## 2016-10-10 DIAGNOSIS — O26899 Other specified pregnancy related conditions, unspecified trimester: Secondary | ICD-10-CM

## 2016-10-10 LAB — POCT URINALYSIS DIPSTICK
BILIRUBIN UA: NEGATIVE
GLUCOSE UA: NEGATIVE
KETONES UA: NEGATIVE
Leukocytes, UA: NEGATIVE
Nitrite, UA: NEGATIVE
Protein, UA: NEGATIVE
RBC UA: NEGATIVE
UROBILINOGEN UA: 0.2 U/dL
pH, UA: 5 (ref 5.0–8.0)

## 2016-10-10 MED ORDER — AZITHROMYCIN 500 MG PO TABS: 1000.0000 mg | ORAL_TABLET | Freq: Once | ORAL | 1 refills | Status: AC

## 2016-10-10 NOTE — Progress Notes (Signed)
OBSTETRIC INITIAL PRENATAL VISIT  Subjective:    Jasmine Edwards is being seen today for her first obstetrical visit.  This is not a planned pregnancy. She is a female at 443w6d gestation, Estimated Date of Delivery: 04/25/17 with Patient's last menstrual period was 07/19/2016, consistent with 6 week sono. Her obstetrical history is significant for diet-controlled gestational diabetes in prior pregnancy, Rh negative status, chlamydia early in current pregnancy, treated. Relationship with FOB: significant other, living together. Patient does intend to breast feed. Pregnancy history fully reviewed.    Obstetric History   G2   P1   T1   P0   A0   L1    SAB0   TAB0   Ectopic0   Multiple0   Live Births1     # Outcome Date GA Lbr Len/2nd Weight Sex Delivery Anes PTL Lv  2 Current           1 Term 10/26/13 [id number]   M Vag-Spont  Y LIV    Obstetric Comments  G1 - GDM (diet controlled). PTL at 28 weeks, arrested. IOL at 40 weeks.     Gynecologic History:  Last pap smear was 08/08/2016.  Results were normal.  Denies h/o abnormal pap smears in the past.  Admits history of STIs: Chlamydia 07/2016, with TOC negative for Chlamydia but with Ureaplasma 08/2016.   Past Medical History:  Diagnosis Date  . Diabetes, gestational    history    Family History  Problem Relation Age of Onset  . Diabetes Maternal Aunt   . Hypertension Mother   . Migraines Mother   . Rashes / Skin problems Mother        Eczema    Past Surgical History:  Procedure Laterality Date  . FOOT SURGERY      Social History   Social History  . Marital status: Single    Spouse name: N/A  . Number of children: N/A  . Years of education: N/A   Occupational History  . Not on file.   Social History Main Topics  . Smoking status: Former Games developermoker  . Smokeless tobacco: Never Used  . Alcohol use No  . Drug use: No  . Sexual activity: Yes    Partners: Male   Other Topics Concern  . Not on file   Social  History Narrative  . No narrative on file     Current Outpatient Prescriptions on File Prior to Visit  Medication Sig Dispense Refill  . Prenatal Vit-Fe Fumarate-FA (PRENATAL MULTIVITAMIN) TABS tablet Take 1 tablet by mouth daily at 12 noon.     No current facility-administered medications on file prior to visit.      No Known Allergies   Review of Systems General:Not Present- Fever, Weight Loss and Weight Gain. Skin:Not Present- Rash. HEENT:Not Present- Blurred Vision, Headache and Bleeding Gums. Respiratory:Not Present- Difficulty Breathing. Breast:Not Present- Breast Mass. Cardiovascular:Not Present- Chest Pain, Elevated Blood Pressure, Fainting / Blacking Out and Shortness of Breath. Gastrointestinal:Not Present- Abdominal Pain, Constipation.  Present - Nausea and Vomiting (Diclegis was not really helping, makes her drowsy.  Nausea is mostly at night so she takes 2 now) Female Genitourinary:Not Present- Frequency, Painful Urination, Pelvic Pain, Vaginal Bleeding, Vaginal Discharge, Contractions, regular, Fetal Movements Decreased, Urinary Complaints and Vaginal Fluid. Musculoskeletal:Not Present- Back Pain and Leg Cramps. Neurological:Not Present- Dizziness. Psychiatric:Not Present- Depression.     Objective:   Blood pressure 105/65, pulse 80, weight 139 lb 6.4 oz (63.2 kg), last menstrual period 07/19/2016.  Body  mass index is 21.83 kg/m.  General Appearance:    Alert, cooperative, no distress, appears stated age  Head:    Normocephalic, without obvious abnormality, atraumatic  Eyes:    PERRL, conjunctiva/corneas clear, EOM's intact, both eyes  Ears:    Normal external ear canals, both ears  Nose:   Nares normal, septum midline, mucosa normal, no drainage or sinus tenderness  Throat:   Lips, mucosa, and tongue normal; teeth and gums normal  Neck:   Supple, symmetrical, trachea midline, no adenopathy; thyroid: no enlargement/tenderness/nodules; no carotid bruit  or JVD  Back:     Symmetric, no curvature, ROM normal, no CVA tenderness  Lungs:     Clear to auscultation bilaterally, respirations unlabored  Chest Wall:    No tenderness or deformity   Heart:    Regular rate and rhythm, S1 and S2 normal, no murmur, rub or gallop  Breast Exam:    No tenderness, masses, or nipple abnormality  Abdomen:     Soft, non-tender, bowel sounds active all four quadrants, no masses, no organomegaly.  FH 12.  FHT 170 bpm.  Genitalia:    Pelvic:external genitalia normal, vagina without lesions, discharge, or tenderness, rectovaginal septum  normal. Cervix normal in appearance, no cervical motion tenderness, no adnexal masses or tenderness.  Pregnancy positive findings: uterine enlargement: 12 wk size, nontender.   Rectal:    Normal external sphincter.  No hemorrhoids appreciated. Internal exam not done.   Extremities:   Extremities normal, atraumatic, no cyanosis or edema  Pulses:   2+ and symmetric all extremities  Skin:   Skin color, texture, turgor normal, no rashes or lesions  Lymph nodes:   Cervical, supraclavicular, and axillary nodes normal  Neurologic:   CNII-XII intact, normal strength, sensation and reflexes throughout       Assessment:   Pregnancy at 11 and 6/7 weeks  H/o chlamydia in early pregnancy Rh negative H/o gestational diabetes in prior pregnancy  Plan:   - Initial labs reviewed  Urine culture missing from NOB labs (patient was unable to void at that visit).  Will perform today.  - TOC for chlamydia negative 09/19/2016.  However positive for ureaplasma. Due to h/o preterm labor in prior pregnancy, would recommend treatment of any vaginal infections as this could increase her risk.  Will prescribe Azithromycin. - Rh negative, will need Rhogam at 28 weeks.  - Prenatal vitamins encouraged. - Problem list reviewed and updated. - New OB counseling:  The patient has been given an overview regarding routine prenatal care.  Recommendations regarding  diet, weight gain, and exercise in pregnancy were given. - Prenatal testing, optional genetic testing, and ultrasound use in pregnancy were reviewed.  MaterniT21 testing performed: results reviewed. Normal screen.  - Benefits of Breast Feeding were discussed. The patient is encouraged to consider nursing her baby post partum. - Will need early glucola for history of gestational diabetes in prior pregnancy.  - Follow up in 4 weeks.  50% of 30 min visit spent on counseling and coordination of care.       Hildred Laser, MD [facility]

## 2016-10-11 LAB — MICROSCOPIC EXAMINATION
CASTS: NONE SEEN /LPF
Epithelial Cells (non renal): >10 /hpf — AB (ref 0–10)

## 2016-10-11 LAB — URINALYSIS, ROUTINE W REFLEX MICROSCOPIC
BILIRUBIN UA: NEGATIVE
Glucose, UA: NEGATIVE
Ketones, UA: NEGATIVE
Nitrite, UA: NEGATIVE
PH UA: 5 (ref 5.0–7.5)
PROTEIN UA: NEGATIVE
RBC UA: NEGATIVE
SPEC GRAV UA: 1.022 (ref 1.005–1.030)
Urobilinogen, Ur: 0.2 mg/dL (ref 0.2–1.0)

## 2016-10-12 LAB — URINE CULTURE

## 2016-11-04 ENCOUNTER — Encounter: Payer: BC Managed Care – PPO | Admitting: Obstetrics and Gynecology

## 2016-11-04 ENCOUNTER — Other Ambulatory Visit: Payer: BC Managed Care – PPO

## 2016-11-05 ENCOUNTER — Encounter: Payer: Self-pay | Admitting: Obstetrics and Gynecology

## 2016-11-10 ENCOUNTER — Other Ambulatory Visit: Payer: Self-pay

## 2016-11-10 ENCOUNTER — Other Ambulatory Visit: Payer: BLUE CROSS/BLUE SHIELD

## 2016-11-10 ENCOUNTER — Ambulatory Visit (INDEPENDENT_AMBULATORY_CARE_PROVIDER_SITE_OTHER): Payer: BLUE CROSS/BLUE SHIELD | Admitting: Certified Nurse Midwife

## 2016-11-10 ENCOUNTER — Encounter: Payer: Self-pay | Admitting: Certified Nurse Midwife

## 2016-11-10 ENCOUNTER — Other Ambulatory Visit: Payer: Self-pay | Admitting: Obstetrics and Gynecology

## 2016-11-10 VITALS — BP 104/64 | HR 64 | Wt 140.3 lb

## 2016-11-10 DIAGNOSIS — Z3482 Encounter for supervision of other normal pregnancy, second trimester: Secondary | ICD-10-CM

## 2016-11-10 DIAGNOSIS — O09299 Supervision of pregnancy with other poor reproductive or obstetric history, unspecified trimester: Secondary | ICD-10-CM

## 2016-11-10 DIAGNOSIS — Z8632 Personal history of gestational diabetes: Principal | ICD-10-CM

## 2016-11-10 LAB — POCT URINALYSIS DIPSTICK
Bilirubin, UA: NEGATIVE
Blood, UA: NEGATIVE
GLUCOSE UA: NEGATIVE
Ketones, UA: NEGATIVE
Leukocytes, UA: NEGATIVE
Nitrite, UA: NEGATIVE
Protein, UA: NEGATIVE
UROBILINOGEN UA: 0.2 U/dL
pH, UA: 5 (ref 5.0–8.0)

## 2016-11-10 NOTE — Progress Notes (Signed)
Pt is here for a routine OB visit. 

## 2016-11-10 NOTE — Progress Notes (Signed)
ROB, doing well. Early GTT today. She complains of have episode of head pounding , discussed increase blood volume in pregnancy and potential for light headedness and headaches. She verbalizes understanding. Will follow up via my chart with GTT test results. Follow up 4 wks. For u/s and ROB  Doreene BurkeAnnie Mayda Shippee, CNM

## 2016-11-10 NOTE — Patient Instructions (Signed)

## 2016-11-11 LAB — GLUCOSE, 1 HOUR GESTATIONAL: GESTATIONAL DIABETES SCREEN: 126 mg/dL (ref 65–139)

## 2016-11-25 ENCOUNTER — Telehealth: Payer: Self-pay | Admitting: Certified Nurse Midwife

## 2016-11-25 NOTE — Telephone Encounter (Signed)
Pt is requesting to speak with a nurse b/c she is 18 weeks and she just got out of a meeting and it looks like she urinated on herself. Pt stated that she didn't feel like she had to urinate and wasn't sure if it was urine or not. Please advise. Thanks TNP

## 2016-11-25 NOTE — Telephone Encounter (Signed)
Pt states that she don't know if it was urine or not. Got up and panties was wet/damp. Did not feel the need to urinate. Has had some stomach pain that goes up right side and some cramping period like. Pt to keep pad on while at home and monitor wetness, go to bathroom every 2hr to try to void. If continues to be a problem contact office in am or go to ER if it becomes worse as in [place] but this may be a UTI or where bladder got to full while sitting.

## 2016-11-27 ENCOUNTER — Other Ambulatory Visit: Payer: Self-pay | Admitting: *Deleted

## 2016-11-27 ENCOUNTER — Encounter: Payer: Self-pay | Admitting: Obstetrics and Gynecology

## 2016-11-27 MED ORDER — DOXYLAMINE-PYRIDOXINE 10-10 MG PO TBEC: 1.0000 | DELAYED_RELEASE_TABLET | Freq: Two times a day (BID) | ORAL | 1 refills | Status: DC

## 2016-12-09 ENCOUNTER — Encounter: Payer: Self-pay | Admitting: Obstetrics and Gynecology

## 2016-12-09 ENCOUNTER — Ambulatory Visit (INDEPENDENT_AMBULATORY_CARE_PROVIDER_SITE_OTHER): Payer: BLUE CROSS/BLUE SHIELD | Admitting: Obstetrics and Gynecology

## 2016-12-09 ENCOUNTER — Ambulatory Visit (INDEPENDENT_AMBULATORY_CARE_PROVIDER_SITE_OTHER): Payer: BLUE CROSS/BLUE SHIELD

## 2016-12-09 VITALS — BP 108/61 | HR 73 | Wt 150.4 lb

## 2016-12-09 DIAGNOSIS — Z3482 Encounter for supervision of other normal pregnancy, second trimester: Secondary | ICD-10-CM | POA: Diagnosis not present

## 2016-12-09 DIAGNOSIS — Z3492 Encounter for supervision of normal pregnancy, unspecified, second trimester: Secondary | ICD-10-CM

## 2016-12-09 LAB — POCT URINALYSIS DIPSTICK
BILIRUBIN UA: NEGATIVE
GLUCOSE UA: NEGATIVE
KETONES UA: NEGATIVE
Leukocytes, UA: NEGATIVE
Nitrite, UA: NEGATIVE
Protein, UA: NEGATIVE
RBC UA: NEGATIVE
SPEC GRAV UA: 1.01 (ref 1.010–1.025)
UROBILINOGEN UA: 0.2 U/dL
pH, UA: 6.5 (ref 5.0–8.0)

## 2016-12-09 NOTE — Progress Notes (Signed)
ROB & anatomy scan:still needs diclegis, reminding OK to take. Will plan 3H GTT at glucose test due to previous GDM.  Indications:Anatomy U/S Findings:  Singleton intrauterine pregnancy is visualized with FHR at 152 BPM. Biometrics give an (U/S) Gestational age of 24 5/7 weeks and an (U/S) EDD of 04/30/17; this correlates with the clinically established EDD of 04/25/17.  Fetal presentation is Vertex.  EFW: 289g (11oz). Placenta: Posterior, grade 0, remote to cervix AFI: Adequate with MVP of 3.3 cm.  Anatomic survey is complete and normal; Gender - female  .   Ovaries are not seen Survey of the adnexa demonstrates no adnexal masses. There is no free peritoneal fluid in the cul de sac.  Impression: 1. 19 5/7 week Viable Singleton Intrauterine pregnancy by U/S. 2. (U/S) EDD is consistent with Clinically established (LMP) EDD of 04/25/17. 3. Normal Anatomy Scan

## 2016-12-09 NOTE — Progress Notes (Signed)
ROB- pt had anatomy scan done today, pt is doing well 

## 2017-01-02 ENCOUNTER — Ambulatory Visit (INDEPENDENT_AMBULATORY_CARE_PROVIDER_SITE_OTHER): Payer: BLUE CROSS/BLUE SHIELD | Admitting: Certified Nurse Midwife

## 2017-01-02 VITALS — BP 110/71 | HR 80 | Temp 98.6°F | Wt 156.1 lb

## 2017-01-02 DIAGNOSIS — Z3482 Encounter for supervision of other normal pregnancy, second trimester: Secondary | ICD-10-CM | POA: Diagnosis not present

## 2017-01-02 DIAGNOSIS — R319 Hematuria, unspecified: Secondary | ICD-10-CM | POA: Diagnosis not present

## 2017-01-02 DIAGNOSIS — N898 Other specified noninflammatory disorders of vagina: Secondary | ICD-10-CM

## 2017-01-02 DIAGNOSIS — O26892 Other specified pregnancy related conditions, second trimester: Secondary | ICD-10-CM

## 2017-01-02 DIAGNOSIS — R109 Unspecified abdominal pain: Secondary | ICD-10-CM

## 2017-01-02 DIAGNOSIS — O26899 Other specified pregnancy related conditions, unspecified trimester: Secondary | ICD-10-CM | POA: Diagnosis not present

## 2017-01-02 LAB — POCT URINALYSIS DIPSTICK
BILIRUBIN UA: NEGATIVE
GLUCOSE UA: NEGATIVE
Leukocytes, UA: NEGATIVE
NITRITE UA: NEGATIVE
Protein, UA: NEGATIVE
RBC UA: NEGATIVE
SPEC GRAV UA: 1.02 (ref 1.010–1.025)
Urobilinogen, UA: 0.2 E.U./dL
pH, UA: 5 (ref 5.0–8.0)

## 2017-01-02 NOTE — Patient Instructions (Signed)
Common Medications Safe in Pregnancy  Acne:      Constipation:  Benzoyl Peroxide     Colace  Clindamycin      Dulcolax Suppository  Topica Erythromycin     Fibercon  Salicylic Acid      Metamucil         Miralax AVOID:        Senakot   Accutane    Cough:  Retin-A       Cough Drops  Tetracycline      Phenergan w/ Codeine if Rx  Minocycline      Robitussin (Plain & DM)  Antibiotics:     Crabs/Lice:  Ceclor       RID  Cephalosporins    AVOID:  E-Mycins      Kwell  Keflex  Macrobid/Macrodantin   Diarrhea:  Penicillin      Kao-Pectate  Zithromax      Imodium AD         PUSH FLUIDS AVOID:       Cipro     Fever:  Tetracycline      Tylenol (Regular or Extra  Minocycline       Strength)  Levaquin      Extra Strength-Do not          Exceed 8 tabs/24 hrs Caffeine:        <200mg/day (equiv. To 1 cup of coffee or  approx. 3 12 oz sodas)         Gas: Cold/Hayfever:       Gas-X  Benadryl      Mylicon  Claritin       Phazyme  **Claritin-D        Chlor-Trimeton    Headaches:  Dimetapp      ASA-Free Excedrin  Drixoral-Non-Drowsy     Cold Compress  Mucinex (Guaifenasin)     Tylenol (Regular or Extra  Sudafed/Sudafed-12 Hour     Strength)  **Sudafed PE Pseudoephedrine   Tylenol Cold & Sinus     Vicks Vapor Rub  Zyrtec  **AVOID if Problems With Blood Pressure         Heartburn: Avoid lying down for at least 1 hour after meals  Aciphex      Maalox     Rash:  Milk of Magnesia     Benadryl    Mylanta       1% Hydrocortisone Cream  Pepcid  Pepcid Complete   Sleep Aids:  Prevacid      Ambien   Prilosec       Benadryl  Rolaids       Chamomile Tea  Tums (Limit 4/day)     Unisom  Zantac       Tylenol PM         Warm milk-add vanilla or  Hemorrhoids:       Sugar for taste  Anusol/Anusol H.C.  (RX: Analapram 2.5%)  Sugar Substitutes:  Hydrocortisone OTC     Ok in moderation  Preparation H      Tucks        Vaseline lotion applied to tissue with  wiping    Herpes:     Throat:  Acyclovir      Oragel  Famvir  Valtrex     Vaccines:         Flu Shot Leg Cramps:       *Gardasil  Benadryl      Hepatitis A         Hepatitis B Nasal Spray:         Pneumovax  Saline Nasal Spray     Polio Booster         Tetanus Nausea:       Tuberculosis test or PPD  Vitamin B6 25 mg TID   AVOID:    Dramamine      *Gardasil  Emetrol       Live Poliovirus  Ginger Root 250 mg QID    MMR (measles, mumps &  High Complex Carbs @ Bedtime    rebella)  Sea Bands-Accupressure    Varicella (Chickenpox)  Unisom 1/2 tab TID     *No known complications           If received before Pain:         Known pregnancy;   Darvocet       Resume series after  Lortab        Delivery  Percocet    Yeast:   Tramadol      Femstat  Tylenol 3      Gyne-lotrimin  Ultram       Monistat  Vicodin           MISC:         All Sunscreens           Hair Coloring/highlights          Insect Repellant's          (Including DEET)         Mystic Tans Round Ligament Pain The round ligament is a cord of muscle and tissue that helps to support the uterus. It can become a source of pain during pregnancy if it becomes stretched or twisted as the baby grows. The pain usually begins in the second trimester of pregnancy, and it can come and go until the baby is delivered. It is not a serious problem, and it does not cause harm to the baby. Round ligament pain is usually a short, sharp, and pinching pain, but it can also be a dull, lingering, and aching pain. The pain is felt in the lower side of the abdomen or in the groin. It usually starts deep in the groin and moves up to the outside of the hip area. Pain can occur with:  A sudden change in position.  Rolling over in bed.  Coughing or sneezing.  Physical activity.  Follow these instructions at home: Watch your condition for any changes. Take these steps to help with your pain:  When the pain starts, relax. Then try: ? Sitting  down. ? Flexing your knees up to your abdomen. ? Lying on your side with one pillow under your abdomen and another pillow between your legs. ? Sitting in a warm bath for 15-20 minutes or until the pain goes away.  Take over-the-counter and prescription medicines only as told by your health care provider.  Move slowly when you sit and stand.  Avoid long walks if they cause pain.  Stop or lessen your physical activities if they cause pain.  Contact a health care provider if:  Your pain does not go away with treatment.  You feel pain in your back that you did not have before.  Your medicine is not helping. Get help right away if:  You develop a fever or chills.  You develop uterine contractions.  You develop vaginal bleeding.  You develop nausea or vomiting.  You develop diarrhea.  You have pain when you urinate. This information is not intended to replace advice given to you by your health  care provider. Make sure you discuss any questions you have with your health care provider. Document Released: 12/25/2007 Document Revised: 08/23/2015 Document Reviewed: 05/24/2014 Elsevier Interactive Patient Education  2018 [employer]  

## 2017-01-02 NOTE — Progress Notes (Signed)
Pt is here with c/o poss contractions- large amount of keytones in urine.

## 2017-01-03 LAB — FETAL FIBRONECTIN: Fetal Fibronectin: NEGATIVE

## 2017-01-04 LAB — URINE CULTURE

## 2017-01-05 ENCOUNTER — Encounter: Payer: Self-pay | Admitting: Certified Nurse Midwife

## 2017-01-07 LAB — NUSWAB VAGINITIS PLUS (VG+)
Candida albicans, NAA: NEGATIVE
Candida glabrata, NAA: NEGATIVE
Chlamydia trachomatis, NAA: NEGATIVE
NEISSERIA GONORRHOEAE, NAA: NEGATIVE
TRICH VAG BY NAA: NEGATIVE

## 2017-01-07 NOTE — Progress Notes (Signed)
Subjective:   Jasmine Edwards is a 26 y.o. G2P1001 [redacted]w[redacted]d being seen today for a work in problem obstetrical visit.  She reports low back pain and central lower abdominal pressure for the last day. No relief with home treatment measures. Good fetal movement.   Questions uterine contractions or urinary tract infection due to history of preterm labor in previous pregnancy.  Denies contractions, vaginal bleeding or leaking of fluid. Denies difficulty breathing or respiratory distress, chest pain, dysuria, and leg pain or swelling.   The following portions of the patient's history were reviewed and updated as appropriate: allergies, current medications, past family history, past medical history, past social history, past surgical history and problem list.   Objective:   BP 110/71   Pulse 80   Temp 98.6 F (37 C)   Wt 156 lb 2 oz (70.8 kg)   LMP 07/19/2016   BMI 24.45 kg/m   CONSTITUTIONAL: Well-developed, well-nourished female in no acute distress.   HENT:  Normocephalic, atraumatic.   NECK: Normal range of motion, supple, no masses.     SKIN: Skin is warm and dry. No rash noted. Not diaphoretic. No erythema. No pallor.  NEUROLGIC: Alert and oriented to person, place, and time.   PSYCHIATRIC: Normal mood and affect. Normal behavior. Normal judgment and thought content.  CVS exam: normal rate, regular rhythm, normal S1, S2, no murmurs, rubs, clicks or gallops.  Lungs - Normal respiratory effort, chest expands symmetrically. Lungs are clear to auscultation, no crackles or wheezes; O2 sat: 99% room air.  BREASTS: Not Examined  ABDOMEN: Soft, non distended; Non tender, gravid.  PELVIC:  External Genitalia: Normal  Vagina: White, clear, yellow discharge present  Cervix: closed/thick/high-labs collected   MUSCULOSKELETAL: Normal range of motion. No tenderness.  No cyanosis, clubbing, or edema.  FHT: Fetal Heart Rate (bpm): 148  Uterine Size: Fundal Height: 24 cm  Fetal  Movement: Movement: Present   Assessment:   Pregnancy:  G2P1001 at [redacted]w[redacted]d  1. Supervision of normal intrauterine pregnancy in multigravida in second trimester  - POCT urinalysis dipstick - Fetal fibronectin - NuSwab Vaginitis Plus (VG+)  2. Vaginal discharge during pregnancy in second trimester  - Fetal fibronectin - NuSwab Vaginitis Plus (VG+)  3. Abdominal pain affecting pregnancy  - Fetal fibronectin - NuSwab Vaginitis Plus (VG+)  4. Hematuria, unspecified type  - Urine Culture  Plan:   Labs: Urine, NuSwab, and FFN collected, see orders.   Preterm labor symptoms: vaginal bleeding, contractions and leaking of fluid reviewed in detail. Fetal movement precautions.  Follow up in 1 week as previously scheduled.   Gunnar Bulla, CNM

## 2017-01-09 ENCOUNTER — Encounter: Payer: BLUE CROSS/BLUE SHIELD | Admitting: Certified Nurse Midwife

## 2017-01-12 ENCOUNTER — Encounter: Payer: BLUE CROSS/BLUE SHIELD | Admitting: Certified Nurse Midwife

## 2017-01-16 ENCOUNTER — Ambulatory Visit (INDEPENDENT_AMBULATORY_CARE_PROVIDER_SITE_OTHER): Payer: BLUE CROSS/BLUE SHIELD | Admitting: Certified Nurse Midwife

## 2017-01-16 ENCOUNTER — Encounter: Payer: Self-pay | Admitting: Certified Nurse Midwife

## 2017-01-16 VITALS — BP 101/60 | HR 74 | Wt 162.7 lb

## 2017-01-16 DIAGNOSIS — Z8632 Personal history of gestational diabetes: Secondary | ICD-10-CM

## 2017-01-16 DIAGNOSIS — Z3482 Encounter for supervision of other normal pregnancy, second trimester: Secondary | ICD-10-CM

## 2017-01-16 DIAGNOSIS — O09299 Supervision of pregnancy with other poor reproductive or obstetric history, unspecified trimester: Secondary | ICD-10-CM

## 2017-01-16 LAB — POCT URINALYSIS DIPSTICK
Bilirubin, UA: NEGATIVE
Glucose, UA: NEGATIVE
Ketones, UA: NEGATIVE
Leukocytes, UA: NEGATIVE
NITRITE UA: NEGATIVE
PROTEIN UA: NEGATIVE
Spec Grav, UA: >=1.03 — AB (ref 1.010–1.025)
UROBILINOGEN UA: 0.2 U/dL
pH, UA: 6 (ref 5.0–8.0)

## 2017-01-16 NOTE — Progress Notes (Signed)
ROB- Pt states she is doing well, declines flu vaccine at this time

## 2017-01-16 NOTE — Patient Instructions (Signed)
Pregnancy and Influenza Influenza, also called the flu, is an infection of the respiratory tract. If you are pregnant, you are more likely to catch the flu. You are also more likely to have a more serious case of the flu. This is because pregnancy lowers your body's ability to fight off infections (it weakens your immune system). It also puts additional stress on your heart and lungs, which makes you more likely to have complications. Having a bad case of the flu, especially with a high fever, can be dangerous for your developing baby. It can cause you to go into early labor. How do people get the flu? The flu is caused by the influenza virus. This virus is common every year in the fall and winter. It spreads when virus particles get passed from person to person. You can get the virus if you are near a sick person who is coughing or sneezing. You can also get the virus if you touch something that has the virus on it and then touch your face. How can I protect myself against the flu?  Get a flu shot. The best way to prevent the flu is to get a flu shot before flu season starts. The flu shot is not dangerous for your developing baby. It may even help protect your baby from the flu for up to 6 months after birth. The flu shot is one type of flu vaccine. Another type is a nasal spray vaccine. Do not get the nasal spray vaccine. It is not approved for pregnancy.  Do not come in close contact with sick people.  Do not share food, drinks, or utensils with other people.  Wash your hands often. Use hand sanitizer when soap and water are not available. What should I do if I have flu symptoms? If you have any flu symptoms, call your health care provider right away. Flu symptoms include:  Fever or chills.  Muscle aches.  Headache.  Sore throat.  Nasal congestion.  Cough.  Feeling tired.  Loss of appetite.  Vomiting.  Diarrhea.  You may be able to take an antiviral medicine to keep the flu  from becoming severe and to shorten how long it lasts. What should I do at home if I am diagnosed with the flu?  Do not take any medicine, including cold or flu medicine, unless directed by your health care provider.  If you take antiviral medicine, make sure you finish it even if you start to feel better.  Drink enough fluid to keep your urine clear or pale yellow.  Get plenty of rest. When would I seek immediate medical care if I have the flu?  You have trouble breathing.  You have chest pain.  You begin to have labor pains.  You have a high fever that does not go down after you take medicine.  You do not feel your baby move.  You have diarrhea or vomiting that will not go away. This information is not intended to replace advice given to you by your health care provider. Make sure you discuss any questions you have with your health care provider. Document Released: 01/18/2008 Document Revised: 08/23/2015 Document Reviewed: 02/11/2013 Elsevier Interactive Patient Education  2017 [place]. Common Medications Safe in Pregnancy  Acne:      Constipation:  Benzoyl Peroxide     Colace  Clindamycin      Dulcolax Suppository  Topica Erythromycin     Fibercon  Salicylic Acid      Metamucil  Miralax AVOID:        Senakot   Accutane    Cough:  Retin-A       Cough Drops  Tetracycline      Phenergan w/ Codeine if Rx  Minocycline      Robitussin (Plain & DM)  Antibiotics:     Crabs/Lice:  Ceclor       RID  Cephalosporins    AVOID:  E-Mycins      Kwell  Keflex  Macrobid/Macrodantin   Diarrhea:  Penicillin      Kao-Pectate  Zithromax      Imodium AD         PUSH FLUIDS AVOID:       Cipro     Fever:  Tetracycline      Tylenol (Regular or Extra  Minocycline       Strength)  Levaquin      Extra Strength-Do not          Exceed 8 tabs/24 hrs Caffeine:        <240m/day (equiv. To 1 cup of coffee or  approx. 3 12 oz  sodas)         Gas: Cold/Hayfever:       Gas-X  Benadryl      Mylicon  Claritin       Phazyme  **Claritin-D        Chlor-Trimeton    Headaches:  Dimetapp      ASA-Free Excedrin  Drixoral-Non-Drowsy     Cold Compress  Mucinex (Guaifenasin)     Tylenol (Regular or Extra  Sudafed/Sudafed-12 Hour     Strength)  **Sudafed PE Pseudoephedrine   Tylenol Cold & Sinus     Vicks Vapor Rub  Zyrtec  **AVOID if Problems With Blood Pressure         Heartburn: Avoid lying down for at least 1 hour after meals  Aciphex      Maalox     Rash:  Milk of Magnesia     Benadryl    Mylanta       1% Hydrocortisone Cream  Pepcid  Pepcid Complete   Sleep Aids:  Prevacid      Ambien   Prilosec       Benadryl  Rolaids       Chamomile Tea  Tums (Limit 4/day)     Unisom  Zantac       Tylenol PM         Warm milk-add vanilla or  Hemorrhoids:       Sugar for taste  Anusol/Anusol H.C.  (RX: Analapram 2.5%)  Sugar Substitutes:  Hydrocortisone OTC     Ok in moderation  Preparation H      Tucks        Vaseline lotion applied to tissue with wiping    Herpes:     Throat:  Acyclovir      Oragel  Famvir  Valtrex     Vaccines:         Flu Shot Leg Cramps:       *Gardasil  Benadryl      Hepatitis A         Hepatitis B Nasal Spray:       Pneumovax  Saline Nasal Spray     Polio Booster         Tetanus Nausea:       Tuberculosis test or PPD  Vitamin B6 25 mg TID   AVOID:    Dramamine      *  Gardasil  Emetrol       Live Poliovirus  Ginger Root 250 mg QID    MMR (measles, mumps &  High Complex Carbs @ Bedtime    rebella)  Sea Bands-Accupressure    Varicella (Chickenpox)  Unisom 1/2 tab TID     *No known complications           If received before Pain:         Known pregnancy;   Darvocet       Resume series after  Lortab        Delivery  Percocet    Yeast:   Tramadol      Femstat  Tylenol 3      Gyne-lotrimin  Ultram       Monistat  Vicodin           MISC:         All  Sunscreens           Hair Coloring/highlights          Insect Repellant's          (Including DEET)         Mystic Tans Third Trimester of Pregnancy The third trimester is from week 28 through week 40 (months 7 through 9). The third trimester is a time when the unborn baby (fetus) is growing rapidly. At the end of the ninth month, the fetus is about 20 inches in length and weighs 6-10 pounds. Body changes during your third trimester Your body will continue to go through many changes during pregnancy. The changes vary from woman to woman. During the third trimester:  Your weight will continue to increase. You can expect to gain 25-35 pounds (11-16 kg) by the end of the pregnancy.  You may begin to get stretch marks on your hips, abdomen, and breasts.  You may urinate more often because the fetus is moving lower into your pelvis and pressing on your bladder.  You may develop or continue to have heartburn. This is caused by increased hormones that slow down muscles in the digestive tract.  You may develop or continue to have constipation because increased hormones slow digestion and cause the muscles that push waste through your intestines to relax.  You may develop hemorrhoids. These are swollen veins (varicose veins) in the rectum that can itch or be painful.  You may develop swollen, bulging veins (varicose veins) in your legs.  You may have increased body aches in the pelvis, back, or thighs. This is due to weight gain and increased hormones that are relaxing your joints.  You may have changes in your hair. These can include thickening of your hair, rapid growth, and changes in texture. Some women also have hair loss during or after pregnancy, or hair that feels dry or thin. Your hair will most likely return to normal after your baby is born.  Your breasts will continue to grow and they will continue to become tender. A yellow fluid (colostrum) may leak from your breasts. This is the  first milk you are producing for your baby.  Your belly button may stick out.  You may notice more swelling in your hands, face, or ankles.  You may have increased tingling or numbness in your hands, arms, and legs. The skin on your belly may also feel numb.  You may feel short of breath because of your expanding uterus.  You may have more problems sleeping. This can be caused by the size of your  belly, increased need to urinate, and an increase in your body's metabolism.  You may notice the fetus "dropping," or moving lower in your abdomen (lightening).  You may have increased vaginal discharge.  You may notice your joints feel loose and you may have pain around your pelvic bone.  What to expect at prenatal visits You will have prenatal exams every 2 weeks until week 36. Then you will have weekly prenatal exams. During a routine prenatal visit:  You will be weighed to make sure you and the baby are growing normally.  Your blood pressure will be taken.  Your abdomen will be measured to track your baby's growth.  The fetal heartbeat will be listened to.  Any test results from the previous visit will be discussed.  You may have a cervical check near your due date to see if your cervix has softened or thinned (effaced).  You will be tested for Group B streptococcus. This happens between 35 and 37 weeks.  Your health care provider may ask you:  What your birth plan is.  How you are feeling.  If you are feeling the baby move.  If you have had any abnormal symptoms, such as leaking fluid, bleeding, severe headaches, or abdominal cramping.  If you are using any tobacco products, including cigarettes, chewing tobacco, and electronic cigarettes.  If you have any questions.  Other tests or screenings that may be performed during your third trimester include:  Blood tests that check for low iron levels (anemia).  Fetal testing to check the health, activity level, and growth  of the fetus. Testing is done if you have certain medical conditions or if there are problems during the pregnancy.  Nonstress test (NST). This test checks the health of your baby to make sure there are no signs of problems, such as the baby not getting enough oxygen. During this test, a belt is placed around your belly. The baby is made to move, and its heart rate is monitored during movement.  What is false labor? False labor is a condition in which you feel small, irregular tightenings of the muscles in the womb (contractions) that usually go away with rest, changing position, or drinking water. These are called Braxton Hicks contractions. Contractions may last for hours, days, or even weeks before true labor sets in. If contractions come at regular intervals, become more frequent, increase in intensity, or become painful, you should see your health care provider. What are the signs of labor?  Abdominal cramps.  Regular contractions that start at 10 minutes apart and become stronger and more frequent with time.  Contractions that start on the top of the uterus and spread down to the lower abdomen and back.  Increased pelvic pressure and dull back pain.  A watery or bloody mucus discharge that comes from the vagina.  Leaking of amniotic fluid. This is also known as your "water breaking." It could be a slow trickle or a gush. Let your health care provider know if it has a color or strange odor. If you have any of these signs, call your health care provider right away, even if it is before your due date. Follow these instructions at home: Medicines  Follow your health care provider's instructions regarding medicine use. Specific medicines may be either safe or unsafe to take during pregnancy.  Take a prenatal vitamin that contains at least 600 micrograms (mcg) of folic acid.  If you develop constipation, try taking a stool softener if your health  care provider approves. Eating and  drinking  Eat a balanced diet that includes fresh fruits and vegetables, whole grains, good sources of protein such as meat, eggs, or tofu, and low-fat dairy. Your health care provider will help you determine the amount of weight gain that is right for you.  Avoid raw meat and uncooked cheese. These carry germs that can cause birth defects in the baby.  If you have low calcium intake from food, talk to your health care provider about whether you should take a daily calcium supplement.  Eat four or five small meals rather than three large meals a day.  Limit foods that are high in fat and processed sugars, such as fried and sweet foods.  To prevent constipation: ? Drink enough fluid to keep your urine clear or pale yellow. ? Eat foods that are high in fiber, such as fresh fruits and vegetables, whole grains, and beans. Activity  Exercise only as directed by your health care provider. Most women can continue their usual exercise routine during pregnancy. Try to exercise for 30 minutes at least 5 days a week. Stop exercising if you experience uterine contractions.  Avoid heavy lifting.  Do not exercise in extreme heat or humidity, or at high altitudes.  Wear low-heel, comfortable shoes.  Practice good posture.  You may continue to have sex unless your health care provider tells you otherwise. Relieving pain and discomfort  Take frequent breaks and rest with your legs elevated if you have leg cramps or low back pain.  Take warm sitz baths to soothe any pain or discomfort caused by hemorrhoids. Use hemorrhoid cream if your health care provider approves.  Wear a good support bra to prevent discomfort from breast tenderness.  If you develop varicose veins: ? Wear support pantyhose or compression stockings as told by your healthcare provider. ? Elevate your feet for 15 minutes, 3-4 times a day. Prenatal care  Write down your questions. Take them to your prenatal visits.  Keep all  your prenatal visits as told by your health care provider. This is important. Safety  Wear your seat belt at all times when driving.  Make a list of emergency phone numbers, including numbers for family, friends, the hospital, and police and fire departments. General instructions  Avoid cat litter boxes and soil used by cats. These carry germs that can cause birth defects in the baby. If you have a cat, ask someone to clean the litter box for you.  Do not travel far distances unless it is absolutely necessary and only with the approval of your health care provider.  Do not use hot tubs, steam rooms, or saunas.  Do not drink alcohol.  Do not use any products that contain nicotine or tobacco, such as cigarettes and e-cigarettes. If you need help quitting, ask your health care provider.  Do not use any medicinal herbs or unprescribed drugs. These chemicals affect the formation and growth of the baby.  Do not douche or use tampons or scented sanitary pads.  Do not cross your legs for long periods of time.  To prepare for the arrival of your baby: ? Take prenatal classes to understand, practice, and ask questions about labor and delivery. ? Make a trial run to the hospital. ? Visit the hospital and tour the maternity area. ? Arrange for maternity or paternity leave through employers. ? Arrange for family and friends to take care of pets while you are in the hospital. ? Purchase a rear-facing car  seat and make sure you know how to install it in your car. ? Pack your hospital bag. ? Prepare the baby's nursery. Make sure to remove all pillows and stuffed animals from the baby's crib to prevent suffocation.  Visit your dentist if you have not gone during your pregnancy. Use a soft toothbrush to brush your teeth and be gentle when you floss. Contact a health care provider if:  You are unsure if you are in labor or if your water has broken.  You become dizzy.  You have mild pelvic  cramps, pelvic pressure, or nagging pain in your abdominal area.  You have lower back pain.  You have persistent nausea, vomiting, or diarrhea.  You have an unusual or bad smelling vaginal discharge.  You have pain when you urinate. Get help right away if:  Your water breaks before 37 weeks.  You have regular contractions less than 5 minutes apart before 37 weeks.  You have a fever.  You are leaking fluid from your vagina.  You have spotting or bleeding from your vagina.  You have severe abdominal pain or cramping.  You have rapid weight loss or weight gain.  You have shortness of breath with chest pain.  You notice sudden or extreme swelling of your face, hands, ankles, feet, or legs.  Your baby makes fewer than 10 movements in 2 hours.  You have severe headaches that do not go away when you take medicine.  You have vision changes. Summary  The third trimester is from week 28 through week 40, months 7 through 9. The third trimester is a time when the unborn baby (fetus) is growing rapidly.  During the third trimester, your discomfort may increase as you and your baby continue to gain weight. You may have abdominal, leg, and back pain, sleeping problems, and an increased need to urinate.  During the third trimester your breasts will keep growing and they will continue to become tender. A yellow fluid (colostrum) may leak from your breasts. This is the first milk you are producing for your baby.  False labor is a condition in which you feel small, irregular tightenings of the muscles in the womb (contractions) that eventually go away. These are called Braxton Hicks contractions. Contractions may last for hours, days, or even weeks before true labor sets in.  Signs of labor can include: abdominal cramps; regular contractions that start at 10 minutes apart and become stronger and more frequent with time; watery or bloody mucus discharge that comes from the vagina; increased  pelvic pressure and dull back pain; and leaking of amniotic fluid. This information is not intended to replace advice given to you by your health care provider. Make sure you discuss any questions you have with your health care provider. Document Released: 03/11/2001 Document Revised: 08/23/2015 Document Reviewed: 05/18/2012 Elsevier Interactive Patient Education  2017 [employer].

## 2017-01-16 NOTE — Progress Notes (Signed)
ROB-Pt doing well, no questions or concerns today. Discussed home treatment measures for round ligament pain including the use of abdominal support. Reviewed red flag symptoms and when to call. History of gestational diabetes in previous pregnancy, will do GTT next visit instead of glucola. Reviewed red flag symptoms and when to call. RTC x 3 weeks for GTT, Rhogam, TDaP, and ROB with Pattricia Bossnnie or sooner if needed.

## 2017-02-06 ENCOUNTER — Ambulatory Visit (INDEPENDENT_AMBULATORY_CARE_PROVIDER_SITE_OTHER): Payer: BLUE CROSS/BLUE SHIELD | Admitting: Certified Nurse Midwife

## 2017-02-06 ENCOUNTER — Encounter: Payer: Self-pay | Admitting: Certified Nurse Midwife

## 2017-02-06 ENCOUNTER — Other Ambulatory Visit: Payer: Self-pay | Admitting: Certified Nurse Midwife

## 2017-02-06 ENCOUNTER — Other Ambulatory Visit: Payer: BLUE CROSS/BLUE SHIELD

## 2017-02-06 VITALS — BP 97/61 | HR 84 | Wt 166.3 lb

## 2017-02-06 DIAGNOSIS — Z3482 Encounter for supervision of other normal pregnancy, second trimester: Secondary | ICD-10-CM

## 2017-02-06 DIAGNOSIS — Z23 Encounter for immunization: Secondary | ICD-10-CM

## 2017-02-06 DIAGNOSIS — Z130 Encounter for screening for diseases of the blood and blood-forming organs and certain disorders involving the immune mechanism: Secondary | ICD-10-CM

## 2017-02-06 DIAGNOSIS — O09299 Supervision of pregnancy with other poor reproductive or obstetric history, unspecified trimester: Secondary | ICD-10-CM

## 2017-02-06 DIAGNOSIS — Z8632 Personal history of gestational diabetes: Secondary | ICD-10-CM

## 2017-02-06 DIAGNOSIS — Z3A28 28 weeks gestation of pregnancy: Secondary | ICD-10-CM

## 2017-02-06 LAB — POCT URINALYSIS DIPSTICK
BILIRUBIN UA: NEGATIVE
Ketones, UA: NEGATIVE
NITRITE UA: NEGATIVE
PH UA: 6 (ref 5.0–8.0)
PROTEIN UA: NEGATIVE
RBC UA: NEGATIVE
SPEC GRAV UA: 1.01 (ref 1.010–1.025)
UROBILINOGEN UA: 0.2 U/dL

## 2017-02-06 MED ORDER — RHO D IMMUNE GLOBULIN 1500 UNITS IM SOSY
1500.0000 [IU] | PREFILLED_SYRINGE | Freq: Once | INTRAMUSCULAR | Status: AC
  Administered 2017-02-06: 1500 [IU] via INTRAMUSCULAR

## 2017-02-06 NOTE — Progress Notes (Signed)
ROB, doing well. BTC, Tdap, CBC and 1 hr GTT today. Pt states that she is having some back pain and round ligament pain. Discussed use of belly band and tylenol prn. She endorses good fetal movement. Discussed that she will be coming for visit q 2 wks now. She verbalizes understanding and agrees to plan . Follow up in 2 wk.   Doreene BurkeAnnie Jeanny Rymer, CNM

## 2017-02-06 NOTE — Patient Instructions (Signed)
Tdap Vaccine (Tetanus, Diphtheria and Pertussis): What You Need to Know 1. Why get vaccinated? Tetanus, diphtheria and pertussis are very serious diseases. Tdap vaccine can protect us from these diseases. And, Tdap vaccine given to pregnant women can protect newborn babies against pertussis. TETANUS (Lockjaw) is rare in the [place] today. It causes painful muscle tightening and stiffness, usually all over the body.  It can lead to tightening of muscles in the head and neck so you can't open your mouth, swallow, or sometimes even breathe. Tetanus kills about 1 out of 10 people who are infected even after receiving the best medical care.  DIPHTHERIA is also rare in the [place] today. It can cause a thick coating to form in the back of the throat.  It can lead to breathing problems, heart failure, paralysis, and death.  PERTUSSIS (Whooping Cough) causes severe coughing spells, which can cause difficulty breathing, vomiting and disturbed sleep.  It can also lead to weight loss, incontinence, and rib fractures. Up to 2 in 100 adolescents and 5 in 100 adults with pertussis are hospitalized or have complications, which could include pneumonia or death.  These diseases are caused by bacteria. Diphtheria and pertussis are spread from person to person through secretions from coughing or sneezing. Tetanus enters the body through cuts, scratches, or wounds. Before vaccines, as many as 200,000 cases of diphtheria, 200,000 cases of pertussis, and hundreds of cases of tetanus, were reported in the [place] each year. Since vaccination began, reports of cases for tetanus and diphtheria have dropped by about 99% and for pertussis by about 80%. 2. Tdap vaccine Tdap vaccine can protect adolescents and adults from tetanus, diphtheria, and pertussis. One dose of Tdap is routinely given at age 11 or 12. People who did not get Tdap at that age should get it as soon as possible. Tdap is especially  important for healthcare professionals and anyone having close contact with a baby younger than 12 months. Pregnant women should get a dose of Tdap during every pregnancy, to protect the newborn from pertussis. Infants are most at risk for severe, life-threatening complications from pertussis. Another vaccine, called Td, protects against tetanus and diphtheria, but not pertussis. A Td booster should be given every 10 years. Tdap may be given as one of these boosters if you have never gotten Tdap before. Tdap may also be given after a severe cut or burn to prevent tetanus infection. Your doctor or the person giving you the vaccine can give you more information. Tdap may safely be given at the same time as other vaccines. 3. Some people should not get this vaccine  A person who has ever had a life-threatening allergic reaction after a previous dose of any diphtheria, tetanus or pertussis containing vaccine, OR has a severe allergy to any part of this vaccine, should not get Tdap vaccine. Tell the person giving the vaccine about any severe allergies.  Anyone who had coma or long repeated seizures within 7 days after a childhood dose of DTP or DTaP, or a previous dose of Tdap, should not get Tdap, unless a cause other than the vaccine was found. They can still get Td.  Talk to your doctor if you: ? have seizures or another nervous system problem, ? had severe pain or swelling after any vaccine containing diphtheria, tetanus or pertussis, ? ever had a condition called Guillain-Barr Syndrome (GBS), ? aren't feeling well on the day the shot is scheduled. 4. Risks With any medicine, including   vaccines, there is a chance of side effects. These are usually mild and go away on their own. Serious reactions are also possible but are rare. Most people who get Tdap vaccine do not have any problems with it. Mild problems following Tdap: (Did not interfere with activities)  Pain where the shot was given (about  3 in 4 adolescents or 2 in 3 adults)  Redness or swelling where the shot was given (about 1 person in 5)  Mild fever of at least 100.4F (up to about 1 in 25 adolescents or 1 in 100 adults)  Headache (about 3 or 4 people in 10)  Tiredness (about 1 person in 3 or 4)  Nausea, vomiting, diarrhea, stomach ache (up to 1 in 4 adolescents or 1 in 10 adults)  Chills, sore joints (about 1 person in 10)  Body aches (about 1 person in 3 or 4)  Rash, swollen glands (uncommon)  Moderate problems following Tdap: (Interfered with activities, but did not require medical attention)  Pain where the shot was given (up to 1 in 5 or 6)  Redness or swelling where the shot was given (up to about 1 in 16 adolescents or 1 in 12 adults)  Fever over 102F (about 1 in 100 adolescents or 1 in 250 adults)  Headache (about 1 in 7 adolescents or 1 in 10 adults)  Nausea, vomiting, diarrhea, stomach ache (up to 1 or 3 people in 100)  Swelling of the entire arm where the shot was given (up to about 1 in 500).  Severe problems following Tdap: (Unable to perform usual activities; required medical attention)  Swelling, severe pain, bleeding and redness in the arm where the shot was given (rare).  Problems that could happen after any vaccine:  People sometimes faint after a medical procedure, including vaccination. Sitting or lying down for about 15 minutes can help prevent fainting, and injuries caused by a fall. Tell your doctor if you feel dizzy, or have vision changes or ringing in the ears.  Some people get severe pain in the shoulder and have difficulty moving the arm where a shot was given. This happens very rarely.  Any medication can cause a severe allergic reaction. Such reactions from a vaccine are very rare, estimated at fewer than 1 in a million doses, and would happen within a few minutes to a few hours after the vaccination. As with any medicine, there is a very remote chance of a vaccine  causing a serious injury or death. The safety of vaccines is always being monitored. For more information, visit: www.cdc.gov/vaccinesafety/ 5. What if there is a serious problem? What should I look for? Look for anything that concerns you, such as signs of a severe allergic reaction, very high fever, or unusual behavior. Signs of a severe allergic reaction can include hives, swelling of the face and throat, difficulty breathing, a fast heartbeat, dizziness, and weakness. These would usually start a few minutes to a few hours after the vaccination. What should I do?  If you think it is a severe allergic reaction or other emergency that can't wait, call 9-1-1 or get the person to the nearest hospital. Otherwise, call your doctor.  Afterward, the reaction should be reported to the Vaccine Adverse Event Reporting System (VAERS). Your doctor might file this report, or you can do it yourself through the VAERS web site at www.vaers.hhs.gov, or by calling 1-800-822-7967. ? VAERS does not give medical advice. 6. The National Vaccine Injury Compensation Program The National   Vaccine Injury Compensation Program (VICP) is a federal program that was created to compensate people who may have been injured by certain vaccines. Persons who believe they may have been injured by a vaccine can learn about the program and about filing a claim by calling 1-800-338-2382 or visiting the VICP website at www.hrsa.gov/vaccinecompensation. There is a time limit to file a claim for compensation. 7. How can I learn more?  Ask your doctor. He or she can give you the vaccine package insert or suggest other sources of information.  Call your local or state health department.  Contact the Centers for Disease Control and Prevention (CDC): ? Call 1-800-232-4636 (1-800-CDC-INFO) or ? Visit CDC's website at www.cdc.gov/vaccines CDC Tdap Vaccine VIS (05/24/13) This information is not intended to replace advice given to you by your  health care provider. Make sure you discuss any questions you have with your health care provider. Document Released: 09/16/2011 Document Revised: 12/06/2015 Document Reviewed: 12/06/2015 Elsevier Interactive Patient Education  2017 Elsevier Inc.  

## 2017-02-06 NOTE — Progress Notes (Signed)
Pt states that she is feeling very sick this morning and is asking why is she doing the 3 hr gtt. Explained that she passed the early 1 hr gtt but it is recommended to repeat it at 28 wks. And given her hx that we would do the 3 hr. She states that she would like to try the 1 hr first and if she fails she will come back for the 3 hr.  Order placed for 1 hr GTT per pt request.   Doreene BurkeAnnie Zeven Kocak, CNM

## 2017-02-07 LAB — CBC WITH DIFFERENTIAL/PLATELET
BASOS: 0 %
Basophils Absolute: 0 10*3/uL (ref 0.0–0.2)
EOS (ABSOLUTE): 0 10*3/uL (ref 0.0–0.4)
EOS: 0 %
HEMATOCRIT: 37.8 % (ref 34.0–46.6)
Hemoglobin: 12.5 g/dL (ref 11.1–15.9)
IMMATURE GRANS (ABS): 0 10*3/uL (ref 0.0–0.1)
IMMATURE GRANULOCYTES: 0 %
Lymphocytes Absolute: 0.9 10*3/uL (ref 0.7–3.1)
Lymphs: 12 %
MCH: 31 pg (ref 26.6–33.0)
MCHC: 33.1 g/dL (ref 31.5–35.7)
MCV: 94 fL (ref 79–97)
MONOS ABS: 0.4 10*3/uL (ref 0.1–0.9)
Monocytes: 5 %
NEUTROS ABS: 6.3 10*3/uL (ref 1.4–7.0)
NEUTROS PCT: 83 %
PLATELETS: 225 10*3/uL (ref 150–379)
RBC: 4.03 x10E6/uL (ref 3.77–5.28)
RDW: 12 % — AB (ref 12.3–15.4)
WBC: 7.7 10*3/uL (ref 3.4–10.8)

## 2017-02-07 LAB — GLUCOSE TOLERANCE, 1 HOUR: Glucose, 1Hr PP: 102 mg/dL (ref 65–199)

## 2017-02-09 ENCOUNTER — Encounter: Payer: Self-pay | Admitting: Certified Nurse Midwife

## 2017-02-10 ENCOUNTER — Encounter: Payer: Self-pay | Admitting: Certified Nurse Midwife

## 2017-02-23 ENCOUNTER — Telehealth: Payer: Self-pay | Admitting: Certified Nurse Midwife

## 2017-02-23 NOTE — Telephone Encounter (Signed)
Patient called and stated that she has a green discharge and the patient is having a lot of lower pressure that is causing pain when she tries to stand and walk, The patient described it as a grinding sensation in her hip when the baby moves. The patient would like a call back as soon as possible to determine if she needs to be seen before Wednesday.

## 2017-02-23 NOTE — Telephone Encounter (Signed)
Pt states she has a green d/c x 3 days.  NO odor. Constant lower abd pain when walking. Has not tried tylenol. NO uti sx. NO fever. NO n/v/d. Pos FM. NO VB. No new sex partners. Advised pt to use belly band. Push h20. Tylenol as directed. Offered pt to be seen tomorrow instead of Wed(rob) she wants to wait. Will call back if sx get worse.

## 2017-02-24 ENCOUNTER — Encounter: Payer: BLUE CROSS/BLUE SHIELD | Admitting: Obstetrics and Gynecology

## 2017-02-25 ENCOUNTER — Encounter: Payer: BLUE CROSS/BLUE SHIELD | Admitting: Obstetrics and Gynecology

## 2017-02-25 ENCOUNTER — Ambulatory Visit (INDEPENDENT_AMBULATORY_CARE_PROVIDER_SITE_OTHER): Payer: BLUE CROSS/BLUE SHIELD | Admitting: Obstetrics and Gynecology

## 2017-02-25 VITALS — BP 113/72 | HR 84 | Wt 169.5 lb

## 2017-02-25 DIAGNOSIS — Z3493 Encounter for supervision of normal pregnancy, unspecified, third trimester: Secondary | ICD-10-CM

## 2017-02-25 LAB — POCT URINALYSIS DIPSTICK
Bilirubin, UA: NEGATIVE
Blood, UA: NEGATIVE
Glucose, UA: NEGATIVE
Ketones, UA: 15
NITRITE UA: NEGATIVE
PROTEIN UA: NEGATIVE
SPEC GRAV UA: 1.015 (ref 1.010–1.025)
UROBILINOGEN UA: 0.2 U/dL
pH, UA: 6 (ref 5.0–8.0)

## 2017-02-25 NOTE — Progress Notes (Signed)
ROB- pt is having a lot of pelvic pressure, having some contractions 

## 2017-02-25 NOTE — Progress Notes (Signed)
ROB- reports intense pelvic and increased cramping. Tried belly band and it doesn't help.FFN obtained. PTL precautions discussed. Not been sexually active in weeks due to plevic pressure.

## 2017-02-26 LAB — FETAL FIBRONECTIN: Fetal Fibronectin: NEGATIVE

## 2017-03-11 ENCOUNTER — Ambulatory Visit (INDEPENDENT_AMBULATORY_CARE_PROVIDER_SITE_OTHER): Payer: BLUE CROSS/BLUE SHIELD | Admitting: Certified Nurse Midwife

## 2017-03-11 VITALS — BP 123/76 | HR 78 | Wt 173.7 lb

## 2017-03-11 DIAGNOSIS — Z3493 Encounter for supervision of normal pregnancy, unspecified, third trimester: Secondary | ICD-10-CM

## 2017-03-11 LAB — POCT URINALYSIS DIPSTICK
BILIRUBIN UA: NEGATIVE
Blood, UA: NEGATIVE
GLUCOSE UA: NEGATIVE
Ketones, UA: NEGATIVE
LEUKOCYTES UA: NEGATIVE
Nitrite, UA: NEGATIVE
Protein, UA: NEGATIVE
Spec Grav, UA: 1.01 (ref 1.010–1.025)
Urobilinogen, UA: 0.2 E.U./dL
pH, UA: 6 (ref 5.0–8.0)

## 2017-03-11 NOTE — Patient Instructions (Signed)

## 2017-03-11 NOTE — Progress Notes (Signed)
ROB, doing well. She has concerns of babies position. She states that she had ultrasound last week and baby was beach. Leopold exam today baby felt vertex. Explained that we would examine her at next visit and confirm positioning. IF we are unsure we will complete u/s . She verbalizes understanding and agrees to plan. Anticipatory guidance for GBS at next visit. Follow up 2 wks.    Doreene BurkeAnnie Erice Ahles, CNM

## 2017-03-11 NOTE — Progress Notes (Signed)
ROB- pt is doing well 

## 2017-03-20 ENCOUNTER — Inpatient Hospital Stay
Admission: EM | Admit: 2017-03-20 | Discharge: 2017-03-20 | Disposition: A | Discharge status: Home / Self Care | Payer: BLUE CROSS/BLUE SHIELD | Attending: Obstetrics and Gynecology | Admitting: Obstetrics and Gynecology

## 2017-03-20 DIAGNOSIS — O4703 False labor before 37 completed weeks of gestation, third trimester: Secondary | ICD-10-CM

## 2017-03-20 DIAGNOSIS — Z3A34 34 weeks gestation of pregnancy: Secondary | ICD-10-CM | POA: Diagnosis not present

## 2017-03-20 LAB — URINALYSIS, ROUTINE W REFLEX MICROSCOPIC
BACTERIA UA: NONE SEEN
Bilirubin Urine: NEGATIVE
GLUCOSE, UA: NEGATIVE mg/dL
HGB URINE DIPSTICK: NEGATIVE
KETONES UR: NEGATIVE mg/dL
NITRITE: NEGATIVE
PROTEIN: 30 mg/dL — AB
Specific Gravity, Urine: 1.019 (ref 1.005–1.030)
pH: 6 (ref 5.0–8.0)

## 2017-03-20 LAB — FETAL FIBRONECTIN: Fetal Fibronectin: NEGATIVE

## 2017-03-20 NOTE — OB Triage Note (Signed)
Pt G2P1 2742w6d presents to L&D from ER for decreased fetal movement since last night around 2100. Pt states she ate a banana and peanut butter and still noted no movement. Since being on the monitor, the patient states she has felt more fetal movement. Pt states she was having contractions last night at 2000 and "woke her every hour out of her sleep". States her ctx are every 30 min-1 hour today and her only complaint of pain is pressure in vagina and back. Pt rates pain 6/10. Pt states she has noticed increased leaking of fluid for the past two days. Clear, no odor, moderate amount per pt. VSS. Monitors applied and assessing.

## 2017-03-20 NOTE — OB Triage Provider Note (Signed)
L&D OB Triage Note  Jasmine Edwards is a 26 y.o. G2P1001 female at 953w6d, EDD Estimated Date of Delivery: 04/25/17 who presented to triage for complaints of irregular contractions, decreased fetal movement and leaking some mucus/fluid.  She was evaluated by the nurses with no significant findings/findings significant for PTL or ruptured membranes. Vital signs stable. An NST was performed and has been reviewed by Me. She was treated with po hydration and reassured. .  cvx FT/th/OOP, NTZ and FFN negative. NST INTERPRETATION: Indications: decreased fetal movement and rule out uterine contractions  Mode: External Baseline Rate (A): 150 bpm Variability: Moderate Accelerations: 15 x 15 Decelerations: None     Contraction Frequency (min): ui  Impression: reactive   Plan: NST performed was reviewed and was found to be reactive. She was discharged home with bleeding/labor precautions.  Continue routine prenatal care. Follow up with OB/GYN as previously scheduled.     Mercie Balsley Suzan NailerN Jenipher Havel, CNM

## 2017-03-27 ENCOUNTER — Encounter: Payer: Self-pay | Admitting: Certified Nurse Midwife

## 2017-03-27 ENCOUNTER — Ambulatory Visit (INDEPENDENT_AMBULATORY_CARE_PROVIDER_SITE_OTHER): Payer: BLUE CROSS/BLUE SHIELD | Admitting: Certified Nurse Midwife

## 2017-03-27 VITALS — BP 107/71 | HR 101 | Wt 174.1 lb

## 2017-03-27 DIAGNOSIS — Z3685 Encounter for antenatal screening for Streptococcus B: Secondary | ICD-10-CM

## 2017-03-27 DIAGNOSIS — Z202 Contact with and (suspected) exposure to infections with a predominantly sexual mode of transmission: Secondary | ICD-10-CM

## 2017-03-27 DIAGNOSIS — Z3493 Encounter for supervision of normal pregnancy, unspecified, third trimester: Secondary | ICD-10-CM

## 2017-03-27 LAB — POCT URINALYSIS DIPSTICK
Bilirubin, UA: NEGATIVE
Blood, UA: NEGATIVE
GLUCOSE UA: NEGATIVE
KETONES UA: NEGATIVE
Leukocytes, UA: NEGATIVE
Nitrite, UA: NEGATIVE
Odor: NEGATIVE
Protein, UA: NEGATIVE
Urobilinogen, UA: 0.2 E.U./dL
pH, UA: 6 (ref 5.0–8.0)

## 2017-03-27 NOTE — Patient Instructions (Signed)

## 2017-03-27 NOTE — Progress Notes (Signed)
ROB- having sinus pressure. Nasal congestion.

## 2017-03-27 NOTE — Progress Notes (Signed)
ROB, doing well. She is having braxton hicks and has fetal movement. GBS today. Discussed labor precautions. Follow up 1 wks.   Doreene BurkeAnnie Talajah Slimp, CNM

## 2017-03-29 LAB — GC/CHLAMYDIA PROBE AMP
Chlamydia trachomatis, NAA: NEGATIVE
Neisseria gonorrhoeae by PCR: NEGATIVE

## 2017-03-29 LAB — STREP GP B NAA: Strep Gp B NAA: POSITIVE — AB

## 2017-03-30 ENCOUNTER — Encounter: Payer: Self-pay | Admitting: Certified Nurse Midwife

## 2017-03-31 NOTE — L&D Delivery Note (Signed)
      Delivery Note   Ayleah Berniece SalinesBrittany Cancel is a 27 y.o. [id number] at 7048w2d Estimated Date of Delivery: 04/25/17  PRE-OPERATIVE DIAGNOSIS:  1) 5548w2d pregnancy.   POST-OPERATIVE DIAGNOSIS:  1) 7048w2d pregnancy s/p Vaginal, Spontaneous   Delivery Type: Vaginal, Spontaneous    Delivery Anesthesia: Epidural   Labor Complications:   None    ESTIMATED BLOOD LOSS: 250 ml    FINDINGS:   1) female infant, Apgar scores of 8    at 1 minute and 9    at 5 minutes and a birthweight of 114.99  ounces.    2) Nuchal cord: no  SPECIMENS:   PLACENTA:   Appearance: Intact , 3 vessels, cord blood sample collected   Removal: Spontaneous      Disposition:   held per protocol then discarded  DISPOSITION:  Infant to left in stable condition in the delivery room, with L&D personnel and mother,  NARRATIVE SUMMARY: Labor course:  Ms. Justice RocherMeagan Brittany Stankiewicz is a [id number] at 8448w2d who presented for induction of labor.  She progressed well in labor with pitocin.  She received the appropriate anesthesia and proceeded to complete dilation. She evidenced good maternal expulsive effort during the second stage. She went on to deliver a viable female infant " Emberlyn". The placenta delivered without problems and was noted to be complete. A perineal and vaginal examination was performed. Lacerations:   1st degree perineal  Laceration were repaired with 3-0 Vicryl Rapide suture. The patient tolerated this well.  Doreene Burkennie Keylee Shrestha, CNM 04/20/2017 12:39 PM

## 2017-04-03 ENCOUNTER — Telehealth: Payer: Self-pay

## 2017-04-03 ENCOUNTER — Ambulatory Visit (INDEPENDENT_AMBULATORY_CARE_PROVIDER_SITE_OTHER): Payer: BLUE CROSS/BLUE SHIELD | Admitting: Certified Nurse Midwife

## 2017-04-03 ENCOUNTER — Encounter: Payer: Self-pay | Admitting: Certified Nurse Midwife

## 2017-04-03 VITALS — BP 134/84 | HR 76 | Wt 174.7 lb

## 2017-04-03 DIAGNOSIS — Z3493 Encounter for supervision of normal pregnancy, unspecified, third trimester: Secondary | ICD-10-CM | POA: Diagnosis not present

## 2017-04-03 LAB — POCT URINALYSIS DIPSTICK
Bilirubin, UA: NEGATIVE
Blood, UA: NEGATIVE
Glucose, UA: NEGATIVE
KETONES UA: NEGATIVE
NITRITE UA: NEGATIVE
PH UA: 6 (ref 5.0–8.0)
PROTEIN UA: NEGATIVE
SPEC GRAV UA: 1.01 (ref 1.010–1.025)
UROBILINOGEN UA: 0.2 U/dL

## 2017-04-03 NOTE — Patient Instructions (Signed)
Braxton Hicks Contractions °Contractions of the uterus can occur throughout pregnancy, but they are not always a sign that you are in labor. You may have practice contractions called Braxton Hicks contractions. These false labor contractions are sometimes confused with true labor. °What are Braxton Hicks contractions? °Braxton Hicks contractions are tightening movements that occur in the muscles of the uterus before labor. Unlike true labor contractions, these contractions do not result in opening (dilation) and thinning of the cervix. Toward the end of pregnancy (32-34 weeks), Braxton Hicks contractions can happen more often and may become stronger. These contractions are sometimes difficult to tell apart from true labor because they can be very uncomfortable. You should not feel embarrassed if you go to the hospital with false labor. °Sometimes, the only way to tell if you are in true labor is for your health care provider to look for changes in the cervix. The health care provider will do a physical exam and may monitor your contractions. If you are not in true labor, the exam should show that your cervix is not dilating and your water has not broken. °If there are other health problems associated with your pregnancy, it is completely safe for you to be sent home with false labor. You may continue to have Braxton Hicks contractions until you go into true labor. °How to tell the difference between true labor and false labor °True labor °· Contractions last 30-70 seconds. °· Contractions become very regular. °· Discomfort is usually felt in the top of the uterus, and it spreads to the lower abdomen and low back. °· Contractions do not go away with walking. °· Contractions usually become more intense and increase in frequency. °· The cervix dilates and gets thinner. °False labor °· Contractions are usually shorter and not as strong as true labor contractions. °· Contractions are usually irregular. °· Contractions  are often felt in the front of the lower abdomen and in the groin. °· Contractions may go away when you walk around or change positions while lying down. °· Contractions get weaker and are shorter-lasting as time goes on. °· The cervix usually does not dilate or become thin. °Follow these instructions at home: °· Take over-the-counter and prescription medicines only as told by your health care provider. °· Keep up with your usual exercises and follow other instructions from your health care provider. °· Eat and drink lightly if you think you are going into labor. °· If Braxton Hicks contractions are making you uncomfortable: °? Change your position from lying down or resting to walking, or change from walking to resting. °? Sit and rest in a tub of warm water. °? Drink enough fluid to keep your urine pale yellow. Dehydration may cause these contractions. °? Do slow and deep breathing several times an hour. °· Keep all follow-up prenatal visits as told by your health care provider. This is important. °Contact a health care provider if: °· You have a fever. °· You have continuous pain in your abdomen. °Get help right away if: °· Your contractions become stronger, more regular, and closer together. °· You have fluid leaking or gushing from your vagina. °· You pass blood-tinged mucus (bloody show). °· You have bleeding from your vagina. °· You have low back pain that you never had before. °· You feel your baby’s head pushing down and causing pelvic pressure. °· Your baby is not moving inside you as much as it used to. °Summary °· Contractions that occur before labor are called Braxton   Hicks contractions, false labor, or practice contractions. °· Braxton Hicks contractions are usually shorter, weaker, farther apart, and less regular than true labor contractions. True labor contractions usually become progressively stronger and regular and they become more frequent. °· Manage discomfort from Braxton Hicks contractions by  changing position, resting in a warm bath, drinking plenty of water, or practicing deep breathing. °This information is not intended to replace advice given to you by your health care provider. Make sure you discuss any questions you have with your health care provider. °Document Released: 07/31/2016 Document Revised: 07/31/2016 Document Reviewed: 07/31/2016 °Elsevier Interactive Patient Education © 2018 Elsevier Inc. ° °

## 2017-04-03 NOTE — Progress Notes (Signed)
ROB,  Pt doing well. She is concerned about fetal movement stating that she feels movement 3-5 a day. During exam movement visible, audible excel. Pt placed on NST for her reassurance. She is tearful stating that she is uncomfortable and is having difficulty caring for her 27 yr old. Discussed elective induction at 39 wks. Will revisit at next visit.  NST: Reactive- Baseline: 140's, Moderate variability, Accelerations present, Decelerations absent,, Toco: occasional mild.   Work note given for breaks to stand/move q 2-3 hrs. Given.   Doreene BurkeAnnie Jeriah Skufca, CNM

## 2017-04-03 NOTE — Telephone Encounter (Signed)
Attempted to fax disability papers to number listed on form but the line gets answered. Voicemail message left for pt to call with correct fax number.

## 2017-04-03 NOTE — Progress Notes (Signed)
ROB- Pt is concerned about bilateral hand and feet swelling, the swelling in her left foot has caused her some pain, she is also concerned about baby movements

## 2017-04-06 ENCOUNTER — Encounter: Payer: Self-pay | Admitting: Certified Nurse Midwife

## 2017-04-10 ENCOUNTER — Ambulatory Visit (INDEPENDENT_AMBULATORY_CARE_PROVIDER_SITE_OTHER): Payer: BLUE CROSS/BLUE SHIELD | Admitting: Certified Nurse Midwife

## 2017-04-10 VITALS — BP 112/77 | HR 87 | Wt 180.0 lb

## 2017-04-10 DIAGNOSIS — Z3493 Encounter for supervision of normal pregnancy, unspecified, third trimester: Secondary | ICD-10-CM

## 2017-04-10 LAB — POCT URINALYSIS DIPSTICK
BILIRUBIN UA: NEGATIVE
Blood, UA: NEGATIVE
GLUCOSE UA: NEGATIVE
KETONES UA: NEGATIVE
LEUKOCYTES UA: NEGATIVE
Nitrite, UA: NEGATIVE
SPEC GRAV UA: 1.015 (ref 1.010–1.025)
Urobilinogen, UA: 0.2 E.U./dL
pH, UA: 6 (ref 5.0–8.0)

## 2017-04-10 MED ORDER — METRONIDAZOLE 500 MG PO TABS: 500.0000 mg | ORAL_TABLET | Freq: Two times a day (BID) | ORAL | 0 refills | Status: DC

## 2017-04-10 NOTE — Progress Notes (Signed)
ROB- pt is having pelvic pressure, having some"fluid" leaking since last night. Negative nitrazine, negative fern, and no pooling present on speculum exam. SVE unchanged since last visit. Education regarding practice policies on induction of labor. Reviewed red flag symptoms and when to call. RTC x 1 week for ROB or sooner if needed.

## 2017-04-16 ENCOUNTER — Ambulatory Visit (INDEPENDENT_AMBULATORY_CARE_PROVIDER_SITE_OTHER): Payer: BLUE CROSS/BLUE SHIELD | Admitting: Certified Nurse Midwife

## 2017-04-16 VITALS — BP 129/81 | HR 89 | Wt 179.6 lb

## 2017-04-16 DIAGNOSIS — Z3493 Encounter for supervision of normal pregnancy, unspecified, third trimester: Secondary | ICD-10-CM

## 2017-04-16 LAB — POCT URINALYSIS DIPSTICK
Bilirubin, UA: NEGATIVE
GLUCOSE UA: NEGATIVE
Ketones, UA: NEGATIVE
LEUKOCYTES UA: NEGATIVE
Nitrite, UA: NEGATIVE
PH UA: 6 (ref 5.0–8.0)
Protein, UA: NEGATIVE
RBC UA: NEGATIVE
SPEC GRAV UA: 1.015 (ref 1.010–1.025)
UROBILINOGEN UA: 0.2 U/dL

## 2017-04-16 NOTE — Progress Notes (Signed)
ROB-Reports increased vaginal discharge, irregular contractions and pelvic pressure. Nitrazine negative. SVE unchanged. NST performed today was reviewed and was found to be reactive. Baseline 140 bpm with moderate variability, accelerations present and no decelerations noted. Requests elective induction of labor at 39 weeks, risk and benefits discussed. IOL scheduled 04/20/2017 @ 0600 per Porfirio Mylararmen, [facility] RN. Copy of IOL form given to Dr. Valentino Saxonherry as information. Reviewed red flag symptoms and when to call. RTC x 6 weeks for PPV with Pattricia BossAnnie or sooner if needed.

## 2017-04-16 NOTE — Patient Instructions (Signed)
Labor Induction Labor induction is when steps are taken to cause a pregnant woman to begin the labor process. Most women go into labor on their own between 37 weeks and 42 weeks of the pregnancy. When this does not happen or when there is a medical need, methods may be used to induce labor. Labor induction causes a pregnant woman's uterus to contract. It also causes the cervix to soften (ripen), open (dilate), and thin out (efface). Usually, labor is not induced before 39 weeks of the pregnancy unless there is a problem with the baby or mother. Before inducing labor, your health care provider will consider a number of factors, including the following:  The medical condition of you and the baby.  How many weeks along you are.  The status of the baby's lung maturity.  The condition of the cervix.  The position of the baby. What are the reasons for labor induction? Labor may be induced for the following reasons:  The health of the baby or mother is at risk.  The pregnancy is overdue by 1 week or more.  The water breaks but labor does not start on its own.  The mother has a health condition or serious illness, such as high blood pressure, infection, placental abruption, or diabetes.  The amniotic fluid amounts are low around the baby.  The baby is distressed. Convenience or wanting the baby to be born on a certain date is not a reason for inducing labor. What methods are used for labor induction? Several methods of labor induction may be used, such as:  Prostaglandin medicine. This medicine causes the cervix to dilate and ripen. The medicine will also start contractions. It can be taken by mouth or by inserting a suppository into the vagina.  Inserting a thin tube (catheter) with a balloon on the end into the vagina to dilate the cervix. Once inserted, the balloon is expanded with water, which causes the cervix to open.  Stripping the membranes. Your health care provider separates  amniotic sac tissue from the cervix, causing the cervix to be stretched and causing the release of a hormone called progesterone. This may cause the uterus to contract. It is often done during an office visit. You will be sent home to wait for the contractions to begin. You will then come in for an induction.  Breaking the water. Your health care provider makes a hole in the amniotic sac using a small instrument. Once the amniotic sac breaks, contractions should begin. This may still take hours to see an effect.  Medicine to trigger or strengthen contractions. This medicine is given through an IV access tube inserted into a vein in your arm. All of the methods of induction, besides stripping the membranes, will be done in the hospital. Induction is done in the hospital so that you and the baby can be carefully monitored. How long does it take for labor to be induced? Some inductions can take up to 2-3 days. Depending on the cervix, it usually takes less time. It takes longer when you are induced early in the pregnancy or if this is your first pregnancy. If a mother is still pregnant and the induction has been going on for 2-3 days, either the mother will be sent home or a cesarean delivery will be needed. What are the risks associated with labor induction? Some of the risks of induction include:  Changes in fetal heart rate, such as too high, too low, or erratic.  Fetal distress.    Chance of infection for the mother and baby.  Increased chance of having a cesarean delivery.  Breaking off (abruption) of the placenta from the uterus (rare).  Uterine rupture (very rare). When induction is needed for medical reasons, the benefits of induction may outweigh the risks. What are some reasons for not inducing labor? Labor induction should not be done if:  It is shown that your baby does not tolerate labor.  You have had previous surgeries on your uterus, such as a myomectomy or the removal of  fibroids.  Your placenta lies very low in the uterus and blocks the opening of the cervix (placenta previa).  Your baby is not in a head-down position.  The umbilical cord drops down into the birth canal in front of the baby. This could cut off the baby's blood and oxygen supply.  You have had a previous cesarean delivery.  There are unusual circumstances, such as the baby being extremely premature. This information is not intended to replace advice given to you by your health care provider. Make sure you discuss any questions you have with your health care provider. Document Released: 08/06/2006 Document Revised: 08/23/2015 Document Reviewed: 10/14/2012 Elsevier Interactive Patient Education  2017 Elsevier Inc.  

## 2017-04-16 NOTE — Progress Notes (Signed)
ROB- pt has been very uncomfortable, having a lot of pelvic pressure, lots of pressure in her rectum

## 2017-04-17 ENCOUNTER — Encounter: Payer: BLUE CROSS/BLUE SHIELD | Admitting: Certified Nurse Midwife

## 2017-04-20 ENCOUNTER — Inpatient Hospital Stay: Payer: BLUE CROSS/BLUE SHIELD | Admitting: Anesthesiology

## 2017-04-20 ENCOUNTER — Inpatient Hospital Stay
Admission: EM | Admit: 2017-04-20 | Discharge: 2017-04-22 | DRG: 807 | Disposition: A | Discharge status: Home / Self Care | Payer: BLUE CROSS/BLUE SHIELD | Source: Ambulatory Visit | Attending: Certified Nurse Midwife | Admitting: Certified Nurse Midwife

## 2017-04-20 ENCOUNTER — Other Ambulatory Visit: Payer: Self-pay

## 2017-04-20 DIAGNOSIS — Z87891 Personal history of nicotine dependence: Secondary | ICD-10-CM

## 2017-04-20 DIAGNOSIS — Z3A39 39 weeks gestation of pregnancy: Secondary | ICD-10-CM

## 2017-04-20 DIAGNOSIS — Z6791 Unspecified blood type, Rh negative: Secondary | ICD-10-CM | POA: Diagnosis not present

## 2017-04-20 DIAGNOSIS — O26893 Other specified pregnancy related conditions, third trimester: Principal | ICD-10-CM | POA: Diagnosis present

## 2017-04-20 DIAGNOSIS — O99824 Streptococcus B carrier state complicating childbirth: Secondary | ICD-10-CM | POA: Diagnosis present

## 2017-04-20 DIAGNOSIS — Z3490 Encounter for supervision of normal pregnancy, unspecified, unspecified trimester: Secondary | ICD-10-CM | POA: Diagnosis present

## 2017-04-20 LAB — TYPE AND SCREEN
ABO/RH(D): O NEG
Antibody Screen: POSITIVE

## 2017-04-20 LAB — CBC
HEMATOCRIT: 36.7 % (ref 35.0–47.0)
Hemoglobin: 12.4 g/dL (ref 12.0–16.0)
MCH: 31.1 pg (ref 26.0–34.0)
MCHC: 33.9 g/dL (ref 32.0–36.0)
MCV: 91.6 fL (ref 80.0–100.0)
Platelets: 208 10*3/uL (ref 150–440)
RBC: 4 MIL/uL (ref 3.80–5.20)
RDW: 13.3 % (ref 11.5–14.5)
WBC: 7.5 10*3/uL (ref 3.6–11.0)

## 2017-04-20 LAB — ABO/RH: ABO/RH(D): O NEG

## 2017-04-20 MED ORDER — SODIUM CHLORIDE 0.9 % IV SOLN: INTRAVENOUS | Status: DC

## 2017-04-20 MED ORDER — ONDANSETRON HCL 4 MG PO TABS: 4.0000 mg | ORAL_TABLET | ORAL | Status: DC | PRN

## 2017-04-20 MED ORDER — ACETAMINOPHEN 325 MG PO TABS: 650.0000 mg | ORAL_TABLET | ORAL | Status: DC | PRN

## 2017-04-20 MED ORDER — LACTATED RINGERS IV SOLN
INTRAVENOUS | Status: DC
  Administered 2017-04-20: 07:00:00 via INTRAVENOUS

## 2017-04-20 MED ORDER — METHYLERGONOVINE MALEATE 0.2 MG PO TABS: 0.2000 mg | ORAL_TABLET | ORAL | Status: DC | PRN

## 2017-04-20 MED ORDER — SIMETHICONE 80 MG PO CHEW: 80.0000 mg | CHEWABLE_TABLET | ORAL | Status: DC | PRN

## 2017-04-20 MED ORDER — FENTANYL 2.5 MCG/ML W/ROPIVACAINE 0.15% IN NS 100 ML EPIDURAL (ARMC)
EPIDURAL | Status: AC
  Filled 2017-04-20: qty 100

## 2017-04-20 MED ORDER — BUPIVACAINE HCL (PF) 0.25 % IJ SOLN
INTRAMUSCULAR | Status: DC | PRN
  Administered 2017-04-20 (×2): 5 mL via EPIDURAL

## 2017-04-20 MED ORDER — WITCH HAZEL-GLYCERIN EX PADS: 1.0000 "application " | MEDICATED_PAD | CUTANEOUS | Status: DC | PRN

## 2017-04-20 MED ORDER — TERBUTALINE SULFATE 1 MG/ML IJ SOLN: 0.2500 mg | Freq: Once | INTRAMUSCULAR | Status: DC | PRN

## 2017-04-20 MED ORDER — COCONUT OIL OIL: 1.0000 "application " | TOPICAL_OIL | Status: DC | PRN

## 2017-04-20 MED ORDER — IBUPROFEN 600 MG PO TABS
600.0000 mg | ORAL_TABLET | Freq: Four times a day (QID) | ORAL | Status: DC
  Administered 2017-04-20: 600 mg via ORAL
  Filled 2017-04-20: qty 1

## 2017-04-20 MED ORDER — FERROUS SULFATE 325 (65 FE) MG PO TABS
325.0000 mg | ORAL_TABLET | Freq: Every day | ORAL | Status: DC
  Administered 2017-04-21 – 2017-04-22 (×2): 325 mg via ORAL
  Filled 2017-04-20 (×2): qty 1

## 2017-04-20 MED ORDER — OXYCODONE-ACETAMINOPHEN 5-325 MG PO TABS: 2.0000 | ORAL_TABLET | ORAL | Status: DC | PRN

## 2017-04-20 MED ORDER — PRENATAL MULTIVITAMIN CH
1.0000 | ORAL_TABLET | Freq: Every day | ORAL | Status: DC
  Administered 2017-04-21 – 2017-04-22 (×2): 1 via ORAL
  Filled 2017-04-20 (×2): qty 1

## 2017-04-20 MED ORDER — AMMONIA AROMATIC IN INHA
RESPIRATORY_TRACT | Status: AC
  Filled 2017-04-20: qty 10

## 2017-04-20 MED ORDER — DIBUCAINE 1 % RE OINT: 1.0000 "application " | TOPICAL_OINTMENT | RECTAL | Status: DC | PRN

## 2017-04-20 MED ORDER — OXYCODONE-ACETAMINOPHEN 5-325 MG PO TABS: 1.0000 | ORAL_TABLET | ORAL | Status: DC | PRN

## 2017-04-20 MED ORDER — LACTATED RINGERS IV SOLN
500.0000 mL | INTRAVENOUS | Status: DC | PRN
  Administered 2017-04-20: 500 mL via INTRAVENOUS

## 2017-04-20 MED ORDER — OXYTOCIN BOLUS FROM INFUSION
500.0000 mL | Freq: Once | INTRAVENOUS | Status: AC
  Administered 2017-04-20: 500 mL via INTRAVENOUS

## 2017-04-20 MED ORDER — PENICILLIN G POT IN DEXTROSE 60000 UNIT/ML IV SOLN
3.0000 10*6.[IU] | INTRAVENOUS | Status: DC
  Administered 2017-04-20: 3 10*6.[IU] via INTRAVENOUS
  Filled 2017-04-20 (×7): qty 50

## 2017-04-20 MED ORDER — DOCUSATE SODIUM 100 MG PO CAPS
100.0000 mg | ORAL_CAPSULE | Freq: Two times a day (BID) | ORAL | Status: DC
  Administered 2017-04-20 – 2017-04-22 (×4): 100 mg via ORAL
  Filled 2017-04-20 (×4): qty 1

## 2017-04-20 MED ORDER — ZOLPIDEM TARTRATE 5 MG PO TABS: 5.0000 mg | ORAL_TABLET | Freq: Every evening | ORAL | Status: DC | PRN

## 2017-04-20 MED ORDER — OXYTOCIN 40 UNITS IN LACTATED RINGERS INFUSION - SIMPLE MED
1.0000 m[IU]/min | INTRAVENOUS | Status: DC
  Administered 2017-04-20: 2 m[IU]/min via INTRAVENOUS
  Filled 2017-04-20: qty 1000

## 2017-04-20 MED ORDER — ONDANSETRON HCL 4 MG/2ML IJ SOLN: 4.0000 mg | Freq: Four times a day (QID) | INTRAMUSCULAR | Status: DC | PRN

## 2017-04-20 MED ORDER — LIDOCAINE-EPINEPHRINE (PF) 1.5 %-1:200000 IJ SOLN
INTRAMUSCULAR | Status: DC | PRN
  Administered 2017-04-20: 3 mL via PERINEURAL

## 2017-04-20 MED ORDER — IBUPROFEN 600 MG PO TABS
600.0000 mg | ORAL_TABLET | Freq: Four times a day (QID) | ORAL | Status: DC
  Administered 2017-04-20 – 2017-04-22 (×6): 600 mg via ORAL
  Filled 2017-04-20 (×9): qty 1

## 2017-04-20 MED ORDER — ONDANSETRON HCL 4 MG/2ML IJ SOLN: 4.0000 mg | INTRAMUSCULAR | Status: DC | PRN

## 2017-04-20 MED ORDER — FENTANYL 2.5 MCG/ML W/ROPIVACAINE 0.15% IN NS 100 ML EPIDURAL (ARMC)
EPIDURAL | Status: DC | PRN
  Administered 2017-04-20: 12 mL/h via EPIDURAL

## 2017-04-20 MED ORDER — SOD CITRATE-CITRIC ACID 500-334 MG/5ML PO SOLN: 30.0000 mL | ORAL | Status: DC | PRN

## 2017-04-20 MED ORDER — OXYTOCIN 40 UNITS IN LACTATED RINGERS INFUSION - SIMPLE MED: 2.5000 [IU]/h | INTRAVENOUS | Status: DC

## 2017-04-20 MED ORDER — BUTORPHANOL TARTRATE 1 MG/ML IJ SOLN
1.0000 mg | INTRAMUSCULAR | Status: DC | PRN
  Administered 2017-04-20: 1 mg via INTRAVENOUS
  Filled 2017-04-20: qty 1

## 2017-04-20 MED ORDER — METHYLERGONOVINE MALEATE 0.2 MG/ML IJ SOLN: 0.2000 mg | INTRAMUSCULAR | Status: DC | PRN

## 2017-04-20 MED ORDER — MISOPROSTOL 200 MCG PO TABS
ORAL_TABLET | ORAL | Status: AC
  Filled 2017-04-20: qty 4

## 2017-04-20 MED ORDER — LIDOCAINE HCL (PF) 1 % IJ SOLN
30.0000 mL | INTRAMUSCULAR | Status: AC | PRN
  Administered 2017-04-20: 3 mL via SUBCUTANEOUS
  Filled 2017-04-20: qty 30

## 2017-04-20 MED ORDER — OXYTOCIN 10 UNIT/ML IJ SOLN
INTRAMUSCULAR | Status: AC
  Filled 2017-04-20: qty 2

## 2017-04-20 MED ORDER — BENZOCAINE-MENTHOL 20-0.5 % EX AERO
1.0000 "application " | INHALATION_SPRAY | CUTANEOUS | Status: DC | PRN
  Administered 2017-04-20: 1 via TOPICAL
  Filled 2017-04-20: qty 56

## 2017-04-20 MED ORDER — FLEET ENEMA 7-19 GM/118ML RE ENEM: 1.0000 | ENEMA | Freq: Every day | RECTAL | Status: DC | PRN

## 2017-04-20 MED ORDER — PENICILLIN G POTASSIUM 5000000 UNITS IJ SOLR
5.0000 10*6.[IU] | Freq: Once | INTRAVENOUS | Status: AC
  Administered 2017-04-20: 5 10*6.[IU] via INTRAVENOUS
  Filled 2017-04-20: qty 5

## 2017-04-20 NOTE — Anesthesia Preprocedure Evaluation (Signed)
Anesthesia Evaluation  Patient identified by MRN, date of birth, ID band Patient awake    Reviewed: Allergy & Precautions, H&P , NPO status , Patient's Chart, lab work & pertinent test results  History of Anesthesia Complications Negative for: history of anesthetic complications  Airway Mallampati: II  TM Distance: <3 FB Neck ROM: full    Dental no notable dental hx.    Pulmonary former smoker,    Pulmonary exam normal        Cardiovascular negative cardio ROS Normal cardiovascular exam     Neuro/Psych negative neurological ROS  negative psych ROS   GI/Hepatic negative GI ROS, Neg liver ROS,   Endo/Other  negative endocrine ROSdiabetes  Renal/GU negative Renal ROS  negative genitourinary   Musculoskeletal   Abdominal   Peds  Hematology negative hematology ROS (+)   Anesthesia Other Findings   Reproductive/Obstetrics (+) Pregnancy                             Anesthesia Physical Anesthesia Plan  ASA: II  Anesthesia Plan: Epidural   Post-op Pain Management:    Induction:   PONV Risk Score and Plan:   Airway Management Planned:   Additional Equipment:   Intra-op Plan:   Post-operative Plan:   Informed Consent: I have reviewed the patients History and Physical, chart, labs and discussed the procedure including the risks, benefits and alternatives for the proposed anesthesia with the patient or authorized representative who has indicated his/her understanding and acceptance.     Plan Discussed with: Anesthesiologist and CRNA  Anesthesia Plan Comments:         Anesthesia Quick Evaluation

## 2017-04-20 NOTE — Progress Notes (Signed)
Patient transferred to MB unit via wheelchair in stable condition.  Report to Okey Regalarol, Charity fundraiserN.

## 2017-04-20 NOTE — H&P (Signed)
History and Physical   HPI  Jasmine Edwards is a 27 y.o. G2P1001 at 361w2d Estimated Date of Delivery: 04/25/17 who is being admitted for  induction of labor   OB History  Obstetric History   G2   P1   T1   P0   A0   L1    SAB0   TAB0   Ectopic0   Multiple0   Live Births1     # Outcome Date GA Lbr Len/2nd Weight Sex Delivery Anes PTL Lv  2 Current           1 Term 10/26/13 6524w0d   M Vag-Spont  Y LIV    Obstetric Comments  G1 - GDM (diet controlled). PTL at 28 weeks, arrested. IOL at 40 weeks.     PROBLEM LIST  Pregnancy complications or risks: Patient Active Problem List   Diagnosis Date Noted  . Encounter for induction of labor 04/20/2017  . Rh negative state in antepartum period 10/10/2016  . 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 History of gestational diabetes in prior pregnancy, currently pregnant 10/10/2016  . Allergic rhinitis 09/19/2016  . Chlamydia trachomatis infection in pregnancy in first trimester 08/13/2016    Prenatal labs and studies: ABO, Rh: --/--/PENDING (01/21 0745) Antibody: PENDING (01/21 0745) Rubella: 1.26 (06/22 1653) RPR: Non Reactive (06/22 1653)  HBsAg: Negative (06/22 1653)  HIV: Non Reactive (06/22 1653)  FAO:ZHYQMVHQGBS:Positive (12/28 1417)   Past Medical History:  Diagnosis Date  . Diabetes, gestational    history     Past Surgical History:  Procedure Laterality Date  . FOOT SURGERY       Medications    Current Discharge Medication List    CONTINUE these medications which have NOT CHANGED   Details  Doxylamine-Pyridoxine 10-10 MG TBEC Take 1 tablet by mouth 2 (two) times daily. Qty: 120 tablet, Refills: 1    Prenatal Vit-Fe Fumarate-FA (PRENATAL MULTIVITAMIN) TABS tablet Take 1 tablet by mouth daily at 12 noon.    metroNIDAZOLE (FLAGYL) 500 MG tablet Take 1 tablet (500 mg total) by mouth 2 (two) times daily. Qty: 14 tablet, Refills: 0         Allergies  Patient has no known allergies.  Review of  Systems  Constitutional: negative Eyes: negative Ears, nose, mouth, throat, and face: negative Respiratory: negative Cardiovascular: negative Gastrointestinal: negative Genitourinary:negative Integument/breast: negative Hematologic/lymphatic: negative Musculoskeletal:negative Neurological: negative Behavioral/Psych: negative Endocrine: negative Allergic/Immunologic: negative  Physical Exam  BP 110/63 (BP Location: Right Arm)   Pulse 88   Temp 98.4 F (36.9 C) (Oral)   Resp 16   Ht 5\' 7"  (1.702 m)   Wt 179 lb (81.2 kg)   LMP 07/19/2016   BMI 28.04 kg/m   Lungs:  CTA B Cardio: RRR  Abd: Soft, gravid, NT Presentation: cephalic EXT: No C/C/ 1+ Edema DTRs: 2+ B CERVIX: 5cm  :  80%:   -2:    posterior:    soft  See Prenatal records for more detailed PE.     FHR:  Baseline: 120 bpm, Variability: Good {> 6 bpm), Accelerations: Reactive and Decelerations: Early  Toco: Uterine Contractions: Frequency: Every 3-4 minutes and Duration: 50-80 seconds   Test Results  Results for orders placed or performed during the hospital encounter of 04/20/17 (from the past 24 hour(s))  CBC     Status: None   Collection Time: 04/20/17  6:44 AM  Result Value Ref Range   WBC 7.5 3.6 - 11.0 K/uL   RBC  4.00 3.80 - 5.20 MIL/uL   Hemoglobin 12.4 12.0 - 16.0 g/dL   HCT 16.1 09.6 - 04.5 %   MCV 91.6 80.0 - 100.0 fL   MCH 31.1 26.0 - 34.0 pg   MCHC 33.9 32.0 - 36.0 g/dL   RDW 40.9 81.1 - 91.4 %   Platelets 208 150 - 440 K/uL  Type and screen     Status: None (Preliminary result)   Collection Time: 04/20/17  6:44 AM  Result Value Ref Range   ABO/RH(D) PENDING    Antibody Screen PENDING    Sample Expiration      04/23/2017 Performed at [facility], [address] [place], [place] [address]   Type and screen     Status: None (Preliminary result)   Collection Time: 04/20/17  7:45 AM  Result Value Ref Range   ABO/RH(D) PENDING    Antibody Screen PENDING    Sample  Expiration      04/23/2017 Performed at [facility], [address] [place], [place] [address]    Group B Strep positive  Assessment   G2P1001 at [redacted]w[redacted]d Estimated Date of Delivery: 04/25/17  The fetus is reassuring.   Patient Active Problem List   Diagnosis Date Noted  . Encounter for induction of labor 04/20/2017  . Rh negative state in antepartum period 10/10/2016  . 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 History of gestational diabetes in prior pregnancy, currently pregnant 10/10/2016  . Allergic rhinitis 09/19/2016  . Chlamydia trachomatis infection in pregnancy in first trimester 08/13/2016    Plan  1. Admit to L&D :   IV Pitocin induction 2. EFM:-- Category 1 3. Epidural if desired. Stadol for IV pain until epidural requested. 4. Admission labs completed 5. Anticipate NSVD  Doreene Burke, CNM  04/20/2017 9:08 AM

## 2017-04-20 NOTE — Lactation Note (Signed)
This note was copied from a baby's chart. Lactation Consultation Note  Patient Name: Jasmine Edwards WUJWJ'XToday's Date: 04/20/2017 Reason for consult: Initial assessment Unable to observe a feeding as mom has recently breastfed each time I have checked with her, reports no issues with latching and is hearing swallows, baby was showing cues when I was in room, baby to breast with mom to attempt feeding, baby fell asleep and wouldn't open her mouth, mom has no breastfeeding questions at present but will call if she has a question , need or concern   Maternal Data Formula Feeding for Exclusion: No Does the patient have breastfeeding experience prior to this delivery?: Yes Breastfed her 89 yr old child for 2 yrs Feeding Feeding Type: Breast Fed Length of feed: 15 min(5 min right, 10 min left)  LATCH Score Latch: (did not observe a latch, baby showing cues but fell asleep)                 Interventions    Lactation Tools Discussed/Used     Consult Status Consult Status: PRN    Dyann KiefMarsha D Andrzej Scully 04/20/2017, 4:56 PM

## 2017-04-20 NOTE — Anesthesia Procedure Notes (Signed)
Epidural Patient location during procedure: OB Start time: 04/20/2017 9:19 AM End time: 04/20/2017 9:36 AM  Staffing Resident/CRNA: Junious SilkNoles, Cindi Ghazarian, CRNA Performed: resident/CRNA   Preanesthetic Checklist Completed: patient identified, site marked, surgical consent, pre-op evaluation, timeout performed, IV checked, risks and benefits discussed and monitors and equipment checked  Epidural Patient position: sitting Prep: Betadine Patient monitoring: heart rate, continuous pulse ox and blood pressure Approach: midline Location: L3-L4 Injection technique: LOR air  Needle:  Needle type: Tuohy  Needle gauge: 17 G Needle length: 9 cm and 9 Catheter type: closed end flexible Catheter size: 20 Guage Test dose: negative and 1.5% lidocaine with Epi 1:200 K  Assessment Sensory level: T10 Events: blood not aspirated, injection not painful, no injection resistance, negative IV test and no paresthesia  Additional Notes   Patient tolerated the insertion well without complications.Reason for block:procedure for pain

## 2017-04-20 NOTE — OB Triage Note (Signed)
Patient arrived to L&D for scheduled IOL. Pt. Reports decreased fetal movement and feeling some contractions. Denies leaking of fluid and vaginal bleeding. Abdomen palpates soft. EFM applied and assessing.

## 2017-04-21 LAB — RPR: RPR Ser Ql: NONREACTIVE

## 2017-04-21 LAB — CBC
HEMATOCRIT: 37.7 % (ref 35.0–47.0)
HEMOGLOBIN: 12.5 g/dL (ref 12.0–16.0)
MCH: 30.4 pg (ref 26.0–34.0)
MCHC: 33.2 g/dL (ref 32.0–36.0)
MCV: 91.8 fL (ref 80.0–100.0)
Platelets: 185 10*3/uL (ref 150–440)
RBC: 4.11 MIL/uL (ref 3.80–5.20)
RDW: 13.5 % (ref 11.5–14.5)
WBC: 9.3 10*3/uL (ref 3.6–11.0)

## 2017-04-21 LAB — FETAL SCREEN: FETAL SCREEN: NEGATIVE

## 2017-04-21 MED ORDER — IBUPROFEN 600 MG PO TABS: 600.0000 mg | ORAL_TABLET | Freq: Four times a day (QID) | ORAL | 0 refills | Status: DC

## 2017-04-21 MED ORDER — RHO D IMMUNE GLOBULIN 1500 UNIT/2ML IJ SOSY
300.0000 ug | PREFILLED_SYRINGE | Freq: Once | INTRAMUSCULAR | Status: AC
  Administered 2017-04-21: 300 ug via INTRAMUSCULAR
  Filled 2017-04-21: qty 2

## 2017-04-21 NOTE — Final Progress Note (Signed)
                             Discharge Summary  Date of Admission: 04/20/2017  Date of Discharge: 04/21/2017  Admitting Diagnosis: Induction of labor at 5827w2d  Mode of Delivery: normal spontaneous vaginal delivery                 Discharge Diagnosis: No other diagnosis   Intrapartum Procedures: epidural, GBS prophylaxis and laceration 1st degree perineal   Post partum procedures: rhogam  Complications: none                      Discharge Day SOAP Note:  Progress Note - Vaginal Delivery  Jasmine Berniece SalinesBrittany Lizardo is a 27 y.o. G2P2002 now PP day 1 s/p Vaginal, Spontaneous . Delivery was uncomplicated  Subjective  The patient has the following complaints: has no unusual complaints  Pain is controlled with current medications.   Patient is urinating without difficulty.  She is ambulating well.    Objective  Vital signs: BP 111/68 (BP Location: Right Arm)   Pulse 67   Temp 97.8 F (36.6 C) (Oral)   Resp 16   Ht 5\' 7"  (1.702 m)   Wt 179 lb (81.2 kg)   LMP 07/19/2016   SpO2 99%   Breastfeeding? Unknown   BMI 28.04 kg/m   Physical Exam: Gen: NAD Fundus Fundal Tone: Firm  Lochia Amount: Scant  Perineum Appearance: Intact     Data Review Labs: CBC Latest Ref Rng & Units 04/21/2017 04/20/2017 02/06/2017  WBC 3.6 - 11.0 K/uL 9.3 7.5 7.7  Hemoglobin 12.0 - 16.0 g/dL 16.112.5 09.612.4 04.512.5  Hematocrit 35.0 - 47.0 % 37.7 36.7 37.8  Platelets 150 - 440 K/uL 185 208 225   O NEG Performed at [facility], [address] [place], [place] [address]   Assessment/Plan  Active Problems:   Encounter for induction of labor    Plan for discharge today.   Discharge Instructions: Per After Visit Summary. Activity: Advance as tolerated. Pelvic rest for 6 weeks.  Also refer to After Visit Summary Diet: Regular Medications: Allergies as of 04/21/2017   No Known Allergies     Medication List    STOP taking these medications   Doxylamine-Pyridoxine 10-10 MG Tbec    metroNIDAZOLE 500 MG tablet Commonly known as:  FLAGYL     TAKE these medications   ibuprofen 600 MG tablet Commonly known as:  ADVIL,MOTRIN Take 1 tablet (600 mg total) by mouth every 6 (six) hours.   prenatal multivitamin Tabs tablet Take 1 tablet by mouth daily at 12 noon.      Outpatient follow up:  Postpartum contraception: Husband planning vasectomy. Declines birth control at this time.   Discharged Condition: good  Discharged to: home  Newborn Data: Disposition:home with mother  Apgars: APGAR (1 MIN): 8   APGAR (5 MINS): 9   APGAR (10 MINS):    Baby Feeding: Breast    Doreene Burkennie Tawonna Esquer, CNM  04/21/2017 10:30 AM

## 2017-04-21 NOTE — Lactation Note (Signed)
This note was copied from a baby's chart. Lactation Consultation Note  Patient Name: Jasmine Young BerryMeagan Berg ZOXWR'UToday's Date: 04/21/2017 Reason for consult: Follow-up assessment   Mom states she had fed baby "well around 4:20 pm tonight, but baby still rooting and acting very hungry. I helped her nurse baby in side-lying hold while bili blanket on baby. I heard very few swallows, but Mom said it was a strong nursing, no pain and she heard swallows. But when the baby released the latch her upper edge of nipple was pinched again, so I really doubt there was much milk transfer due to poor latch. I tried a nipple shield and different positions. Baby got sleepy and needed to be under full lights, so I had Mom hands on pump. I fed baby 12 ml of her colostrum. She soon settled down after that.   Plan: Mom to wear breast shells during day; may try NS again (with LC only please) to see if that helps with latch and milk transfer. If still poor latch or poor milk transfer, pump and syringe or slow flow bottle feed baby throughout the night. Mom to eat and then sleep after this.    Maternal Data    Feeding Feeding Type: Breast Milk(baby still rooting after BF attempts) Length of feed: 15 min  LATCH Score Latch: Repeated attempts needed to sustain latch, nipple held in mouth throughout feeding, stimulation needed to elicit sucking reflex.(nipple pinched after she released latch)  Audible Swallowing: A few with stimulation(less swallows than last feedign)  Type of Nipple: Everted at rest and after stimulation(after rolling and reverse compression for a while)  Comfort (Breast/Nipple): Soft / non-tender  Hold (Positioning): Assistance needed to correctly position infant at breast and maintain latch.(side lying)  LATCH Score: 7  Interventions Interventions: Breast feeding basics reviewed;Assisted with latch;Skin to skin;Breast massage;Adjust position;Breast compression;Support pillows;Position  options;Expressed milk  Lactation Tools Discussed/Used     Consult Status      Sunday CornSandra Clark Jkai Arwood 04/21/2017, 6:57 PM

## 2017-04-21 NOTE — Final Progress Note (Signed)
Discharge Day SOAP Note:  Progress Note - Vaginal Delivery  Jasmine Edwards is a 27 y.o. G2P2002 now PP day 1 s/p Vaginal, Spontaneous . Delivery was uncomplicated  Subjective  The patient has the following complaints: has no unusual complaints  Pain is controlled with current medications.   Patient is urinating without difficulty.  She is ambulating well.    Objective  Vital signs: BP 111/68 (BP Location: Right Arm)   Pulse 67   Temp 97.8 F (36.6 C) (Oral)   Resp 16   Ht 5\' 7"  (1.702 m)   Wt 179 lb (81.2 kg)   LMP 07/19/2016   SpO2 99%   Breastfeeding? Unknown   BMI 28.04 kg/m   Physical Exam: Gen: NAD Fundus Fundal Tone: Firm  Lochia Amount: Scant  Perineum Appearance: Intact     Data Review Labs: CBC Latest Ref Rng & Units 04/21/2017 04/20/2017 02/06/2017  WBC 3.6 - 11.0 K/uL 9.3 7.5 7.7  Hemoglobin 12.0 - 16.0 g/dL 40.912.5 81.112.4 91.412.5  Hematocrit 35.0 - 47.0 % 37.7 36.7 37.8  Platelets 150 - 440 K/uL 185 208 225   O NEG Performed at [facility], [address] [place], [place] [address]   Assessment/Plan  Active Problems:   Encounter for induction of labor    Plan for discharge today.   Discharge Instructions: Per After Visit Summary. Activity: Advance as tolerated. Pelvic rest for 6 weeks.  Also refer to After Visit Summary Diet: Regular Medications: Allergies as of 04/21/2017   No Known Allergies     Medication List    STOP taking these medications   Doxylamine-Pyridoxine 10-10 MG Tbec   metroNIDAZOLE 500 MG tablet Commonly known as:  FLAGYL     TAKE these medications   ibuprofen 600 MG tablet Commonly known as:  ADVIL,MOTRIN Take 1 tablet (600 mg total) by mouth every 6 (six) hours.   prenatal multivitamin Tabs tablet Take 1 tablet by mouth daily at 12 noon.      Outpatient follow up:  Postpartum contraception: Husband planning vasectomy. Declines birth control at this time.   Discharged Condition:  good  Discharged to: home  Newborn Data: Disposition:home with mother  Apgars: APGAR (1 MIN): 8   APGAR (5 MINS): 9   APGAR (10 MINS):    Baby Feeding: Breast    Jasmine Edwards, CNM  04/21/2017 10:30 AM

## 2017-04-21 NOTE — Anesthesia Postprocedure Evaluation (Signed)
Anesthesia Post Note  Patient: Jasmine Edwards  Procedure(s) Performed: AN AD HOC LABOR EPIDURAL  Patient location during evaluation: Mother Baby Anesthesia Type: Epidural Level of consciousness: awake and alert and oriented Pain management: satisfactory to patient Vital Signs Assessment: post-procedure vital signs reviewed and stable Respiratory status: respiratory function stable Cardiovascular status: stable Postop Assessment: no backache, no headache, epidural receding, patient able to bend at knees, no apparent nausea or vomiting and adequate PO intake Anesthetic complications: no     Last Vitals:  Vitals:   04/20/17 2318 04/21/17 0313  BP: (!) 111/58 (!) 109/56  Pulse: 61 63  Resp: 18 18  Temp: 36.7 C 36.7 C  SpO2: 99% 99%    Last Pain:  Vitals:   04/21/17 0313  TempSrc: Oral  PainSc:                  Clydene PughBeane, Castiel Lauricella D

## 2017-04-22 LAB — RHOGAM INJECTION: Unit division: 0

## 2017-04-22 NOTE — Final Progress Note (Signed)
Discharge Day SOAP Note:  Progress Note - Vaginal Delivery  Jasmine Edwards is a 27 y.o. G2P2002 now PP day 2 s/p Vaginal, Spontaneous . Delivery was uncomplicated  Subjective  The patient has the following complaints: has no unusual complaints  Pain is controlled with current medications.   Patient is urinating without difficulty.  She is ambulating well.    Objective  Vital signs: Vitals:   04/22/17 0000 04/22/17 0700  BP: 112/68 128/84  Pulse: 66 92  Resp: 18 17  Temp: 97.9 F (36.6 C) 98.2 F (36.8 C)  SpO2: 98% 100%     Physical Exam: Gen: NAD Fundus Fundal Tone: Firm  Lochia Amount: Scant  Perineum Appearance: Intact                Data Review Labs: CBC Latest Ref Rng & Units 04/21/2017 04/20/2017 02/06/2017  WBC 3.6 - 11.0 K/uL 9.3 7.5 7.7  Hemoglobin 12.0 - 16.0 g/dL 40.912.5 81.112.4 91.412.5  Hematocrit 35.0 - 47.0 % 37.7 36.7 37.8  Platelets 150 - 440 K/uL 185 208 225   O NEG Performed at [facility], [address] [place], [place] [address]   Assessment/Plan  Active Problems:   Encounter for induction of labor    Plan for discharge today.   Discharge Instructions: Per After Visit Summary. Activity: Advance as tolerated. Pelvic rest for 6 weeks.  Also refer to After Visit Summary Diet: Regular Medications: Allergies as of 04/21/2017   No Known Allergies             Medication List     STOP taking these medications   Doxylamine-Pyridoxine 10-10 MG Tbec   metroNIDAZOLE 500 MG tablet Commonly known as:  FLAGYL     TAKE these medications   ibuprofen 600 MG tablet Commonly known as:  ADVIL,MOTRIN Take 1 tablet (600 mg total) by mouth every 6 (six) hours.   prenatal multivitamin Tabs tablet Take 1 tablet by mouth daily at 12 noon.      Outpatient follow up:  Postpartum contraception: Husband planning vasectomy. Declines birth control at this time.   Discharged Condition:  good  Discharged to: home, discharge was delayed yesterday due to newborn indications.   Newborn Data: Disposition:home with mother  Apgars: APGAR (1 MIN): 8   APGAR (5 MINS): 9   APGAR (10 MINS):    Baby Feeding: Breast    Doreene BurkeAnnie Carl Butner, CNM

## 2017-04-22 NOTE — Progress Notes (Signed)
Discharge inst reviewed with pt.  Verb u/o of home care

## 2017-04-22 NOTE — Progress Notes (Signed)
Discharge to home; to car via auxillary 

## 2017-04-22 NOTE — Lactation Note (Addendum)
This note was copied from a baby's chart. Lactation Consultation Note  Patient Name: Jasmine Young BerryMeagan Sulton RUEAV'WToday's Date: 04/22/2017 Reason for consult: Follow-up assessment  Mom called me to try breastfeeding again. Baby was rooting. Her breasts are now freely leaking a lot of white breast milk. The shells I gave her for flat nipples were too snug for her large nipples and created a stricture ring around the base of her left nipple, so I switched her to the shells for sore nipples with a bigger hole which can still help her large nipples evert, and the trauma heal more quickly with less pain from friction of clothing.   I set up scale for a pre and post weight check. Baby latched on fairly well in football hold on right with little prompting needed. She soon developed a fair suck/swallow rhythm, but not as good as I was hoping for. After approx 20minutes she released breast and top edge of nipple was creased again, indicating another poor latch. She obtained 10 ml during that feed, but it is becoming too painful for mom.   After discussing options, we agreed on her pumping and slow flow bottle feeding baby for at least another couple days so nipple can heal, baby can gain weight and keep having bili levels drop well, before trying breast again. It may also be a matter of time for baby to "grow into" the large nipples mom has with her small mouth. Baby has been getting at least 20 ml every 2-3 hours. I spoke with her on feeding per cues and diaper counts and weights. Volumes will increase to approx 45-60 ml by end of week.   Mom states she has a Pump in Style at home to use. She plans to have [facility] at peds office do F/U on breastfeeding or call us as needed for out patient consult until feeding better at breast.   Maternal Data    Feeding Feeding Type: Breast Fed(right breast)  LATCH Score Latch: Grasps breast easily, tongue down, lips flanged, rhythmical sucking.  Audible Swallowing: Spontaneous  and intermittent  Type of Nipple: Everted at rest and after stimulation(swollen, large)  Comfort (Breast/Nipple): Filling, red/small blisters or bruises, mild/mod discomfort  Hold (Positioning): Assistance needed to correctly position infant at breast and maintain latch.  LATCH Score: 8  Interventions Interventions: Assisted with latch;Breast compression;Adjust position;Support pillows;Position options;Comfort gels;Shells(pre/post weight check; mom pumped 30 ml 3 hours ago)  Lactation Tools Discussed/Used Tools: Pump   Consult Status Consult Status: PRN(f/u with [facility] at peds office)    Jasmine Edwards 04/22/2017, 2:42 PM

## 2017-04-22 NOTE — Discharge Summary (Signed)
Discharge Summary  Date of Admission: 04/20/2017  Date of Discharge: 04/22/2017  Admitting Diagnosis: Induction of labor at 7550w2d  Mode of Delivery: normal spontaneous vaginal delivery                                                  Discharge Diagnosis: No other diagnosis              Intrapartum Procedures: epidural, GBS prophylaxis and laceration 1st degree perineal              Post partum procedures: rhogam  Complications: none                      Discharge Day SOAP Note:  Progress Note - Vaginal Delivery  Jasmine Edwards is a 27 y.o. G2P2002 now PP day 2 s/p Vaginal, Spontaneous . Delivery was uncomplicated  Subjective  The patient has the following complaints: has no unusual complaints  Pain is controlled with current medications.   Patient is urinating without difficulty.  She is ambulating well.    Objective  Vital signs: Vitals:   04/22/17 0000 04/22/17 0700  BP: 112/68 128/84  Pulse: 66 92  Resp: 18 17  Temp: 97.9 F (36.6 C) 98.2 F (36.8 C)  SpO2: 98% 100%    Physical Exam: Gen: NAD Fundus Fundal Tone: Firm  Lochia Amount: Scant  Perineum Appearance: Intact                Data Review Labs: CBC Latest Ref Rng & Units 04/21/2017 04/20/2017 02/06/2017  WBC 3.6 - 11.0 K/uL 9.3 7.5 7.7  Hemoglobin 12.0 - 16.0 g/dL 16.112.5 09.612.4 04.512.5  Hematocrit 35.0 - 47.0 % 37.7 36.7 37.8  Platelets 150 - 440 K/uL 185 208 225   O NEG Performed at [facility], [address] [place], [place] [address]   Assessment/Plan  Active Problems:   Encounter for induction of labor    Plan for discharge today.   Discharge Instructions: Per After Visit Summary. Activity: Advance as tolerated. Pelvic rest for 6 weeks.  Also refer to After Visit Summary Diet: Regular Medications: Allergies as of 04/21/2017   No Known Allergies             Medication List     STOP taking these medications   Doxylamine-Pyridoxine 10-10  MG Tbec   metroNIDAZOLE 500 MG tablet Commonly known as:  FLAGYL     TAKE these medications   ibuprofen 600 MG tablet Commonly known as:  ADVIL,MOTRIN Take 1 tablet (600 mg total) by mouth every 6 (six) hours.   prenatal multivitamin Tabs tablet Take 1 tablet by mouth daily at 12 noon.      Outpatient follow up:  Postpartum contraception: Husband planning vasectomy. Declines birth control at this time.   Discharged Condition: good  Discharged to: home, discharge was delayed yesterday due to newborn indications.   Newborn Data: Disposition:home with mother  Apgars: APGAR (1 MIN): 8   APGAR (5 MINS): 9   APGAR (10 MINS):    Baby Feeding: Breast    Doreene BurkeAnnie Asah Lamay, CNM

## 2017-06-01 ENCOUNTER — Ambulatory Visit (INDEPENDENT_AMBULATORY_CARE_PROVIDER_SITE_OTHER): Payer: BLUE CROSS/BLUE SHIELD | Admitting: Certified Nurse Midwife

## 2017-06-01 DIAGNOSIS — F53 Postpartum depression: Secondary | ICD-10-CM | POA: Insufficient documentation

## 2017-06-01 DIAGNOSIS — O99345 Other mental disorders complicating the puerperium: Secondary | ICD-10-CM

## 2017-06-01 MED ORDER — SERTRALINE HCL 25 MG PO TABS: 25.0000 mg | ORAL_TABLET | Freq: Every day | ORAL | 0 refills | Status: DC

## 2017-06-01 NOTE — Progress Notes (Signed)
Pt is here for a post partum visit. Is breast feeding, has not had a period, has not resumed intercourse, does not want birth control.

## 2017-06-01 NOTE — Patient Instructions (Signed)
Postpartum Depression and Baby Blues The postpartum period begins right after the birth of a baby. During this time, there is often a great amount of joy and excitement. It is also a time of many changes in the life of the parents. Regardless of how many times a mother gives birth, each child brings new challenges and dynamics to the family. It is not unusual to have feelings of excitement along with confusing shifts in moods, emotions, and thoughts. All mothers are at risk of developing postpartum depression or the "baby blues." These mood changes can occur right after giving birth, or they may occur many months after giving birth. The baby blues or postpartum depression can be mild or severe. Additionally, postpartum depression can go away rather quickly, or it can be a long-term condition. What are the causes? Raised hormone levels and the rapid drop in those levels are thought to be a main cause of postpartum depression and the baby blues. A number of hormones change during and after pregnancy. Estrogen and progesterone usually decrease right after the delivery of your baby. The levels of thyroid hormone and various cortisol steroids also rapidly drop. Other factors that play a role in these mood changes include major life events and genetics. What increases the risk? If you have any of the following risks for the baby blues or postpartum depression, know what symptoms to watch out for during the postpartum period. Risk factors that may increase the likelihood of getting the baby blues or postpartum depression include:  Having a personal or family history of depression.  Having depression while being pregnant.  Having premenstrual mood issues or mood issues related to oral contraceptives.  Having a lot of life stress.  Having marital conflict.  Lacking a social support network.  Having a baby with special needs.  Having health problems, such as diabetes.  What are the signs or  symptoms? Symptoms of baby blues include:  Brief changes in mood, such as going from extreme happiness to sadness.  Decreased concentration.  Difficulty sleeping.  Crying spells, tearfulness.  Irritability.  Anxiety.  Symptoms of postpartum depression typically begin within the first month after giving birth. These symptoms include:  Difficulty sleeping or excessive sleepiness.  Marked weight loss.  Agitation.  Feelings of worthlessness.  Lack of interest in activity or food.  Postpartum psychosis is a very serious condition and can be dangerous. Fortunately, it is rare. Displaying any of the following symptoms is cause for immediate medical attention. Symptoms of postpartum psychosis include:  Hallucinations and delusions.  Bizarre or disorganized behavior.  Confusion or disorientation.  How is this diagnosed? A diagnosis is made by an evaluation of your symptoms. There are no medical or lab tests that lead to a diagnosis, but there are various questionnaires that a health care provider may use to identify those with the baby blues, postpartum depression, or psychosis. Often, a screening tool called the Edinburgh Postnatal Depression Scale is used to diagnose depression in the postpartum period. How is this treated? The baby blues usually goes away on its own in 1-2 weeks. Social support is often all that is needed. You will be encouraged to get adequate sleep and rest. Occasionally, you may be given medicines to help you sleep. Postpartum depression requires treatment because it can last several months or longer if it is not treated. Treatment may include individual or group therapy, medicine, or both to address any social, physiological, and psychological factors that may play a role in the   depression. Regular exercise, a healthy diet, rest, and social support may also be strongly recommended. Postpartum psychosis is more serious and needs treatment right away.  Hospitalization is often needed. Follow these instructions at home:  Get as much rest as you can. Nap when the baby sleeps.  Exercise regularly. Some women find yoga and walking to be beneficial.  Eat a balanced and nourishing diet.  Do little things that you enjoy. Have a cup of tea, take a bubble bath, read your favorite magazine, or listen to your favorite music.  Avoid alcohol.  Ask for help with household chores, cooking, grocery shopping, or running errands as needed. Do not try to do everything.  Talk to people close to you about how you are feeling. Get support from your partner, family members, friends, or other new moms.  Try to stay positive in how you think. Think about the things you are grateful for.  Do not spend a lot of time alone.  Only take over-the-counter or prescription medicine as directed by your health care provider.  Keep all your postpartum appointments.  Let your health care provider know if you have any concerns. Contact a health care provider if: You are having a reaction to or problems with your medicine. Get help right away if:  You have suicidal feelings.  You think you may harm the baby or someone else. This information is not intended to replace advice given to you by your health care provider. Make sure you discuss any questions you have with your health care provider. Document Released: 12/20/2003 Document Revised: 08/23/2015 Document Reviewed: 12/27/2012 Elsevier Interactive Patient Education  2017 Elsevier Inc.  

## 2017-06-01 NOTE — Progress Notes (Signed)
Subjective:    Jasmine Edwards is a 27 y.o. 872P2002 Caucasian female who presents for a postpartum visit. She is 6 weeks postpartum following a spontaneous vaginal delivery at 39.2 gestational weeks. Anesthesia: epidural. I have fully reviewed the prenatal and intrapartum course. Postpartum course has been complicated by postpartum depression. Baby's course has been WNL. Baby is feeding by breast. Bleeding staining only. Bowel function is normal. Bladder function is normal. Patient is not sexually active. Last sexual activity: prior to delivery. Contraception method is condoms. Postpartum depression screening: positive. Score 24.  Last pap 08/08/16 and was negative. Will refer to Jasmine CosierAmanda Edwards for cunseling.  The following portions of the patient's history were reviewed and updated as appropriate: allergies, current medications, past medical history, past surgical history and problem list.  Review of Systems Pertinent items are noted in HPI.   Vitals:   06/01/17 1036  BP: 102/64  Pulse: (!) 104  Weight: 156 lb 9 oz (71 kg)  Height: 5\' 7"  (1.702 m)   Patient's last menstrual period was 07/19/2016.  Objective:   General:  alert, cooperative and no distress   Breasts:  deferred, no complaints  Lungs: clear to auscultation bilaterally  Heart:  regular rate and rhythm  Abdomen: soft, nontender   Vulva: normal  Vagina: normal vagina  Cervix:  closed  Corpus: Well-involuted  Adnexa:  Non-palpable  Rectal Exam: no hemorrhoids        Assessment:   Postpartum exam 6 wks s/p SVD Breastfeeding Depression screening-positive Contraception counseling   Plan:  : condoms- she declines medication Referral for counseling. Zoloft 25 mg daily. She denies desire to hurt herself or the baby. States she is overwhelmed and cant get it together.   She is tearful. Help line for PSI given   Follow up in: 4 weeks for medication check or earlier if needed  Doreene BurkeAnnie Lizmarie Witters, CNM

## 2017-06-10 ENCOUNTER — Encounter: Payer: Self-pay | Admitting: Certified Nurse Midwife

## 2017-06-22 ENCOUNTER — Ambulatory Visit (INDEPENDENT_AMBULATORY_CARE_PROVIDER_SITE_OTHER): Payer: BLUE CROSS/BLUE SHIELD | Admitting: Certified Nurse Midwife

## 2017-06-22 VITALS — BP 101/65 | HR 62 | Ht 67.0 in | Wt 153.2 lb

## 2017-06-22 DIAGNOSIS — F53 Postpartum depression: Secondary | ICD-10-CM | POA: Diagnosis not present

## 2017-06-22 DIAGNOSIS — O99345 Other mental disorders complicating the puerperium: Principal | ICD-10-CM

## 2017-06-22 MED ORDER — SERTRALINE HCL 50 MG PO TABS: 50.0000 mg | ORAL_TABLET | Freq: Every day | ORAL | 0 refills | Status: DC

## 2017-06-22 NOTE — Progress Notes (Signed)
Pt is here for a followup after starting zoloft. Screening 16

## 2017-06-22 NOTE — Progress Notes (Signed)
  Medication Management Clinic Visit Note  Patient: Jasmine Edwards MRN: [id number] Date of Birth: 12/09/1990 PCP: Patient, No Pcp Per   Baron HamperMeagan GrenadaBrittany Michna 27 y.o. female presents for a medication visit today. She was seen on 06/01/17 for her 6 wk PPV and diagnosed with PHq9 of 24 and started on Zoloft 25 mg daily   LMP 07/19/2016   Patient Information   Past Medical History:  Diagnosis Date  . Diabetes, gestational    history      Past Surgical History:  Procedure Laterality Date  . FOOT SURGERY       Family History  Problem Relation Age of Onset  . Diabetes Maternal Aunt   . Hypertension Mother   . Migraines Mother   . Rashes / Skin problems Mother        Eczema    New Diagnoses (since last visit): none  Family Support: Comments: her partner.   Lifestyle; She states she has struggled with fatigue and adjusting from one child to two. Her 713 yr old is acting out.    Social History   Substance and Sexual Activity  Alcohol Use No      Social History   Tobacco Use  Smoking Status Former Smoker  Smokeless Tobacco Never Used      Health Maintenance  Topic Date Due  . INFLUENZA VACCINE  10/29/2016  . PAP SMEAR  08/09/2019  . TETANUS/TDAP  02/07/2027  . HIV Screening  Completed     Assessment and Plan: PHq9 16 , she states she is feeling better but not back to herself. She is not sleeping well and her appetite increases and decreases depending on the day. She has reached out to PSI help line and utilized the on-line groups which she finds helpful. She states she started taking 50 mg of Zoloft the last 1-2 wks because she had to go back to work and was very over whelmed. Encouraged her to continue with the 50 mg. Explained that it can take 4 wks before medication become theraputic. She verbalizes understanding and agrees. She is to return in 3-4 wks for medication check.   I attest more than 50% of visit spent reviewing history discussing current phq  9 score. Discussed coping mechanisms and support. Encouraged counseling. Face to face time 10 min.   Doreene BurkeAnnie Tekeya Geffert, CNM

## 2017-06-22 NOTE — Patient Instructions (Signed)
Postpartum Depression and Baby Blues The postpartum period begins right after the birth of a baby. During this time, there is often a great amount of joy and excitement. It is also a time of many changes in the life of the parents. Regardless of how many times a mother gives birth, each child brings new challenges and dynamics to the family. It is not unusual to have feelings of excitement along with confusing shifts in moods, emotions, and thoughts. All mothers are at risk of developing postpartum depression or the "baby blues." These mood changes can occur right after giving birth, or they may occur many months after giving birth. The baby blues or postpartum depression can be mild or severe. Additionally, postpartum depression can go away rather quickly, or it can be a long-term condition. What are the causes? Raised hormone levels and the rapid drop in those levels are thought to be a main cause of postpartum depression and the baby blues. A number of hormones change during and after pregnancy. Estrogen and progesterone usually decrease right after the delivery of your baby. The levels of thyroid hormone and various cortisol steroids also rapidly drop. Other factors that play a role in these mood changes include major life events and genetics. What increases the risk? If you have any of the following risks for the baby blues or postpartum depression, know what symptoms to watch out for during the postpartum period. Risk factors that may increase the likelihood of getting the baby blues or postpartum depression include:  Having a personal or family history of depression.  Having depression while being pregnant.  Having premenstrual mood issues or mood issues related to oral contraceptives.  Having a lot of life stress.  Having marital conflict.  Lacking a social support network.  Having a baby with special needs.  Having health problems, such as diabetes.  What are the signs or  symptoms? Symptoms of baby blues include:  Brief changes in mood, such as going from extreme happiness to sadness.  Decreased concentration.  Difficulty sleeping.  Crying spells, tearfulness.  Irritability.  Anxiety.  Symptoms of postpartum depression typically begin within the first month after giving birth. These symptoms include:  Difficulty sleeping or excessive sleepiness.  Marked weight loss.  Agitation.  Feelings of worthlessness.  Lack of interest in activity or food.  Postpartum psychosis is a very serious condition and can be dangerous. Fortunately, it is rare. Displaying any of the following symptoms is cause for immediate medical attention. Symptoms of postpartum psychosis include:  Hallucinations and delusions.  Bizarre or disorganized behavior.  Confusion or disorientation.  How is this diagnosed? A diagnosis is made by an evaluation of your symptoms. There are no medical or lab tests that lead to a diagnosis, but there are various questionnaires that a health care provider may use to identify those with the baby blues, postpartum depression, or psychosis. Often, a screening tool called the Edinburgh Postnatal Depression Scale is used to diagnose depression in the postpartum period. How is this treated? The baby blues usually goes away on its own in 1-2 weeks. Social support is often all that is needed. You will be encouraged to get adequate sleep and rest. Occasionally, you may be given medicines to help you sleep. Postpartum depression requires treatment because it can last several months or longer if it is not treated. Treatment may include individual or group therapy, medicine, or both to address any social, physiological, and psychological factors that may play a role in the   depression. Regular exercise, a healthy diet, rest, and social support may also be strongly recommended. Postpartum psychosis is more serious and needs treatment right away.  Hospitalization is often needed. Follow these instructions at home:  Get as much rest as you can. Nap when the baby sleeps.  Exercise regularly. Some women find yoga and walking to be beneficial.  Eat a balanced and nourishing diet.  Do little things that you enjoy. Have a cup of tea, take a bubble bath, read your favorite magazine, or listen to your favorite music.  Avoid alcohol.  Ask for help with household chores, cooking, grocery shopping, or running errands as needed. Do not try to do everything.  Talk to people close to you about how you are feeling. Get support from your partner, family members, friends, or other new moms.  Try to stay positive in how you think. Think about the things you are grateful for.  Do not spend a lot of time alone.  Only take over-the-counter or prescription medicine as directed by your health care provider.  Keep all your postpartum appointments.  Let your health care provider know if you have any concerns. Contact a health care provider if: You are having a reaction to or problems with your medicine. Get help right away if:  You have suicidal feelings.  You think you may harm the baby or someone else. This information is not intended to replace advice given to you by your health care provider. Make sure you discuss any questions you have with your health care provider. Document Released: 12/20/2003 Document Revised: 08/23/2015 Document Reviewed: 12/27/2012 Elsevier Interactive Patient Education  2017 Elsevier Inc.  

## 2017-06-30 ENCOUNTER — Other Ambulatory Visit: Payer: Self-pay | Admitting: Certified Nurse Midwife

## 2017-06-30 ENCOUNTER — Encounter: Payer: Self-pay | Admitting: Certified Nurse Midwife

## 2017-07-01 ENCOUNTER — Other Ambulatory Visit: Payer: Self-pay

## 2017-07-01 MED ORDER — DICLOXACILLIN SODIUM 500 MG PO CAPS: 500.0000 mg | ORAL_CAPSULE | Freq: Four times a day (QID) | ORAL | 0 refills | Status: DC

## 2017-07-20 ENCOUNTER — Encounter: Payer: BLUE CROSS/BLUE SHIELD | Admitting: Certified Nurse Midwife

## 2017-07-20 ENCOUNTER — Other Ambulatory Visit: Payer: Self-pay | Admitting: Certified Nurse Midwife

## 2017-07-22 ENCOUNTER — Ambulatory Visit (INDEPENDENT_AMBULATORY_CARE_PROVIDER_SITE_OTHER): Payer: BLUE CROSS/BLUE SHIELD | Admitting: Certified Nurse Midwife

## 2017-07-22 ENCOUNTER — Encounter: Payer: Self-pay | Admitting: Certified Nurse Midwife

## 2017-07-22 VITALS — BP 106/60 | HR 71 | Ht 67.0 in | Wt 154.2 lb

## 2017-07-22 DIAGNOSIS — Z79899 Other long term (current) drug therapy: Secondary | ICD-10-CM

## 2017-07-22 MED ORDER — SERTRALINE HCL 50 MG PO TABS: 50.0000 mg | ORAL_TABLET | Freq: Every day | ORAL | 5 refills | Status: DC

## 2017-07-22 NOTE — Progress Notes (Signed)
Pt is here for a med check. Increased appetite- pt is breast feeding. Screening 8

## 2017-07-22 NOTE — Progress Notes (Signed)
  Medication Management Clinic Visit Note  Patient: Jasmine RocherMeagan Brittany Edwards MRN: [id number] Date of Birth: 04/17/1990 PCP: Patient, No Pcp Per   Jasmine Edwards 27 y.o. female presents for a medication visit today. She was started on zoloft pp and had an increase in dose on 06/22/17. She is currently taking 50mg  daily.   BP 106/60   Pulse 71   Ht 5\' 7"  (1.702 m)   Wt 154 lb 4 oz (70 kg)   Breastfeeding? Yes   BMI 24.16 kg/m   Patient Information   Past Medical History:  Diagnosis Date  . Depression   . Diabetes, gestational    history      Past Surgical History:  Procedure Laterality Date  . FOOT SURGERY       Family History  Problem Relation Age of Onset  . Diabetes Maternal Aunt   . Hypertension Mother   . Migraines Mother   . Rashes / Skin problems Mother        Eczema    New Diagnoses (since last visit): none  Family Support: Good   Social History   Substance and Sexual Activity  Alcohol Use No      Social History   Tobacco Use  Smoking Status Former Smoker  Smokeless Tobacco Never Used      Health Maintenance  Topic Date Due  . INFLUENZA VACCINE  10/29/2017  . PAP SMEAR  08/09/2019  . TETANUS/TDAP  02/07/2027  . HIV Screening  Completed     Assessment and Plan: PHq9 score 8 , previously 16. Pt states that she feels better, more like herself. She is working on figuring out "who she is now" with 2 children. Information on [facility] given. She is receptive to trying this with her son to help him with his behaviors. Refill for zofran ordered x 6 months. Pt to return in 6 months to discuss continuation or stopping of medication. She verbalizes and agrees to plan.   I attest more than 50% of visit spent reviewing history, discussing current mood, ability to cope, diet, and sleep. Reviewed [facility] services.   Doreene BurkeAnnie Joeanne Robicheaux, CNM

## 2017-07-22 NOTE — Patient Instructions (Signed)
Preventive Care 18-39 Years, Female Preventive care refers to lifestyle choices and visits with your health care provider that can promote health and wellness. What does preventive care include?  A yearly physical exam. This is also called an annual well check.  Dental exams once or twice a year.  Routine eye exams. Ask your health care provider how often you should have your eyes checked.  Personal lifestyle choices, including: ? Daily care of your teeth and gums. ? Regular physical activity. ? Eating a healthy diet. ? Avoiding tobacco and drug use. ? Limiting alcohol use. ? Practicing safe sex. ? Taking vitamin and mineral supplements as recommended by your health care provider. What happens during an annual well check? The services and screenings done by your health care provider during your annual well check will depend on your age, overall health, lifestyle risk factors, and family history of disease. Counseling Your health care provider may ask you questions about your:  Alcohol use.  Tobacco use.  Drug use.  Emotional well-being.  Home and relationship well-being.  Sexual activity.  Eating habits.  Work and work Statistician.  Method of birth control.  Menstrual cycle.  Pregnancy history.  Screening You may have the following tests or measurements:  Height, weight, and BMI.  Diabetes screening. This is done by checking your blood sugar (glucose) after you have not eaten for a while (fasting).  Blood pressure.  Lipid and cholesterol levels. These may be checked every 5 years starting at age 66.  Skin check.  Hepatitis C blood test.  Hepatitis B blood test.  Sexually transmitted disease (STD) testing.  BRCA-related cancer screening. This may be done if you have a family history of breast, ovarian, tubal, or peritoneal cancers.  Pelvic exam and Pap test. This may be done every 3 years starting at age 40. Starting at age 59, this may be done every 5  years if you have a Pap test in combination with an HPV test.  Discuss your test results, treatment options, and if necessary, the need for more tests with your health care provider. Vaccines Your health care provider may recommend certain vaccines, such as:  Influenza vaccine. This is recommended every year.  Tetanus, diphtheria, and acellular pertussis (Tdap, Td) vaccine. You may need a Td booster every 10 years.  Varicella vaccine. You may need this if you have not been vaccinated.  HPV vaccine. If you are 69 or younger, you may need three doses over 6 months.  Measles, mumps, and rubella (MMR) vaccine. You may need at least one dose of MMR. You may also need a second dose.  Pneumococcal 13-valent conjugate (PCV13) vaccine. You may need this if you have certain conditions and were not previously vaccinated.  Pneumococcal polysaccharide (PPSV23) vaccine. You may need one or two doses if you smoke cigarettes or if you have certain conditions.  Meningococcal vaccine. One dose is recommended if you are age 27-21 years and a first-year college student living in a residence hall, or if you have one of several medical conditions. You may also need additional booster doses.  Hepatitis A vaccine. You may need this if you have certain conditions or if you travel or work in places where you may be exposed to hepatitis A.  Hepatitis B vaccine. You may need this if you have certain conditions or if you travel or work in places where you may be exposed to hepatitis B.  Haemophilus influenzae type b (Hib) vaccine. You may need this if  you have certain risk factors.  Talk to your health care provider about which screenings and vaccines you need and how often you need them. This information is not intended to replace advice given to you by your health care provider. Make sure you discuss any questions you have with your health care provider. Document Released: 05/13/2001 Document Revised: 12/05/2015  Document Reviewed: 01/16/2015 Elsevier Interactive Patient Education  [employer].

## 2017-08-18 ENCOUNTER — Other Ambulatory Visit: Payer: Self-pay | Admitting: Certified Nurse Midwife

## 2017-08-26 ENCOUNTER — Other Ambulatory Visit: Payer: Self-pay

## 2017-08-26 MED ORDER — SERTRALINE HCL 50 MG PO TABS: 50.0000 mg | ORAL_TABLET | Freq: Every day | ORAL | 2 refills | Status: DC

## 2017-09-02 ENCOUNTER — Encounter (INDEPENDENT_AMBULATORY_CARE_PROVIDER_SITE_OTHER): Payer: Self-pay

## 2017-09-02 ENCOUNTER — Encounter: Payer: Self-pay | Admitting: Certified Nurse Midwife

## 2017-11-04 ENCOUNTER — Ambulatory Visit (INDEPENDENT_AMBULATORY_CARE_PROVIDER_SITE_OTHER): Payer: BLUE CROSS/BLUE SHIELD | Admitting: Certified Nurse Midwife

## 2017-11-04 VITALS — BP 100/70 | HR 73 | Ht 67.0 in | Wt 153.2 lb

## 2017-11-04 DIAGNOSIS — L0291 Cutaneous abscess, unspecified: Secondary | ICD-10-CM

## 2017-11-04 MED ORDER — SULFAMETHOXAZOLE-TRIMETHOPRIM 800-160 MG PO TABS: 1.0000 | ORAL_TABLET | Freq: Two times a day (BID) | ORAL | 0 refills | Status: AC

## 2017-11-04 NOTE — Progress Notes (Signed)
GYN ENCOUNTER NOTE  Subjective:       Jasmine Edwards is a 27 y.o. G32P2002 female is here for gynecologic evaluation of the following issues:  1. abscess on perineal area on left side That has increased in pain and size over the past 4 days. She denies fever or discharge. She has not tried anything to help.     Obstetric History OB History  Gravida Para Term Preterm AB Living  2 2 2     2   SAB TAB Ectopic Multiple Live Births        0 2    # Outcome Date GA Lbr Len/2nd Weight Sex Delivery Anes PTL Lv  2 Term 04/20/17 [redacted]w[redacted]d / 00:23 7 lb 3 oz (3.26 kg) F Vag-Spont EPI  LIV  1 Term 10/26/13 [redacted]w[redacted]d   M Vag-Spont  Y LIV    Obstetric Comments  G1 - GDM (diet controlled). PTL at 28 weeks, arrested. IOL at 40 weeks.     Past Medical History:  Diagnosis Date  . Depression   . Diabetes, gestational    history    Past Surgical History:  Procedure Laterality Date  . FOOT SURGERY      Current Outpatient Medications on File Prior to Visit  Medication Sig Dispense Refill  . Prenatal Vit-Fe Fumarate-FA (PRENATAL MULTIVITAMIN) TABS tablet Take 1 tablet by mouth daily at 12 noon.    . sertraline (ZOLOFT) 50 MG tablet Take 1 tablet (50 mg total) by mouth daily. 90 tablet 2   No current facility-administered medications on file prior to visit.     No Known Allergies  Social History   Socioeconomic History  . Marital status: Single    Spouse name: Not on file  . Number of children: Not on file  . Years of education: Not on file  . Highest education level: Not on file  Occupational History  . Not on file  Social Needs  . Financial resource strain: Not on file  . Food insecurity:    Worry: Not on file    Inability: Not on file  . Transportation needs:    Medical: Not on file    Non-medical: Not on file  Tobacco Use  . Smoking status: Former Games developer  . Smokeless tobacco: Never Used  Substance and Sexual Activity  . Alcohol use: No  . Drug use: No  . Sexual activity:  Yes    Partners: Male    Birth control/protection: None  Lifestyle  . Physical activity:    Days per week: Not on file    Minutes per session: Not on file  . Stress: Not on file  Relationships  . Social connections:    Talks on phone: Not on file    Gets together: Not on file    Attends religious service: Not on file    Active member of club or organization: Not on file    Attends meetings of clubs or organizations: Not on file    Relationship status: Not on file  . Intimate partner violence:    Fear of current or ex partner: Not on file    Emotionally abused: Not on file    Physically abused: Not on file    Forced sexual activity: Not on file  Other Topics Concern  . Not on file  Social History Narrative  . Not on file    Family History  Problem Relation Age of Onset  . Diabetes Maternal Aunt   . Hypertension Mother   .  Migraines Mother   . Rashes / Skin problems Mother        Eczema    The following portions of the patient's history were reviewed and updated as appropriate: allergies, current medications, past family history, past medical history, past social history, past surgical history and problem list.  Review of Systems Review of Systems - Negative except as mentioned in hpi Review of Systems - General ROS: negative for - chills, fatigue, fever, hot flashes, malaise or night sweats Hematological and Lymphatic ROS: negative for - bleeding problems or swollen lymph nodes Gastrointestinal ROS: negative for - abdominal pain, blood in stools, change in bowel habits and nausea/vomiting Musculoskeletal ROS: negative for - joint pain, muscle pain or muscular weakness Genito-Urinary ROS: negative for - change in menstrual cycle, dysmenorrhea, dyspareunia, dysuria, genital discharge, genital ulcers, hematuria, incontinence, irregular/heavy menses, nocturia or pelvic pain. Positive for painful swollen area on left perineal area  Objective:   BP 100/70   Pulse 73   Ht 5'  7" (1.702 m)   Wt 153 lb 4 oz (69.5 kg)   Breastfeeding? Yes   BMI 24.00 kg/m  CONSTITUTIONAL: Well-developed, well-nourished female in no acute distress.  HENT:  Normocephalic, atraumatic.  NECK: Normal range of motion   SKIN: Skin is warm and dry. No rash noted. Not diaphoretic. No erythema. No pallor. NEUROLGIC: Alert and oriented to person, place, and time.  PSYCHIATRIC: Normal mood and affect. Normal behavior. Normal judgment and thought content. CARDIOVASCULAR:Not Examined RESPIRATORY: Not Examined BREASTS: Not Examined ABDOMEN: Soft, non distended; Non tender.  No Organomegaly. PELVIC: Swollen red area noted on pt left perineal area. It is tender to palpation. 1-2 cm in size. No discharge from abscess with mild squeezing. Pt does not tolerate manipulation.  MUSCULOSKELETAL: Normal range of motion. No tenderness.  No cyanosis, clubbing, or edema.  Assessment:   Left perineal abscess    Plan:  Pt has to return to work and declines I&D today. Discussed self help measures. Warm Epson salt bath, warm compresses and po antibiotics ordered. IF no improvement pt will return for I&D.   Doreene BurkeAnnie Vinicius Brockman, CNM

## 2017-11-04 NOTE — Progress Notes (Signed)
Pt is here with c/o "bump" that is sore and irritated in perineum.

## 2017-11-04 NOTE — Patient Instructions (Signed)

## 2017-11-06 ENCOUNTER — Encounter: Payer: BLUE CROSS/BLUE SHIELD | Admitting: Certified Nurse Midwife

## 2017-11-09 ENCOUNTER — Other Ambulatory Visit: Payer: Self-pay | Admitting: Certified Nurse Midwife

## 2017-11-09 MED ORDER — DICLOXACILLIN SODIUM 250 MG PO CAPS: 250.0000 mg | ORAL_CAPSULE | Freq: Four times a day (QID) | ORAL | 0 refills | Status: AC

## 2017-11-09 NOTE — Progress Notes (Signed)
Mastitis order placed for antibiotics.   Doreene BurkeAnnie Mitzy Naron, CNM

## 2018-01-20 ENCOUNTER — Encounter: Payer: BLUE CROSS/BLUE SHIELD | Admitting: Certified Nurse Midwife

## 2018-01-27 ENCOUNTER — Ambulatory Visit (INDEPENDENT_AMBULATORY_CARE_PROVIDER_SITE_OTHER): Payer: BLUE CROSS/BLUE SHIELD | Admitting: Certified Nurse Midwife

## 2018-01-27 VITALS — BP 113/69 | HR 59 | Ht 67.0 in | Wt 155.1 lb

## 2018-01-27 DIAGNOSIS — F32A Depression, unspecified: Secondary | ICD-10-CM

## 2018-01-27 DIAGNOSIS — Z79899 Other long term (current) drug therapy: Secondary | ICD-10-CM

## 2018-01-27 DIAGNOSIS — F329 Major depressive disorder, single episode, unspecified: Secondary | ICD-10-CM

## 2018-01-27 MED ORDER — DROSPIRENONE 4 MG PO TABS: 1.0000 | ORAL_TABLET | Freq: Every day | ORAL | 4 refills | Status: DC

## 2018-01-27 MED ORDER — SERTRALINE HCL 50 MG PO TABS: 75.0000 mg | ORAL_TABLET | Freq: Every day | ORAL | 1 refills | Status: DC

## 2018-01-27 NOTE — Patient Instructions (Signed)
Depression Screening Depression screening is a tool that your health care provider can use to learn if you have symptoms of depression. Depression is a common condition with many symptoms that are also often found in other conditions. Depression is treatable, but it must first be diagnosed. You may not know that certain feelings, thoughts, and behaviors that you are having can be symptoms of depression. Taking a depression screening test can help you and your health care provider decide if you need more assessment, or if you should be referred to a mental health care provider. What are the screening tests?  You may have a physical exam to see if another condition is affecting your mental health. You may have a blood or urine sample taken during the physical exam.  You may be interviewed using a screening tool that was developed from research, such as one of these: ? Patient Health Questionnaire (PHQ). This is a set of either 2 or 9 questions. A health care provider who has been trained to score this screening test uses a guide to assess if your symptoms suggest that you may have depression. ? Hamilton Depression Rating Scale (HAM-D). This is a set of either 17 or 24 questions. You may be asked to take it again during or after your treatment, to see if your depression has gotten better. ? Beck Depression Inventory (BDI). This is a set of 21 multiple choice questions. Your health care provider scores your answers to assess:  Your level of depression, ranging from mild to severe.  Your response to treatment.  Your health care provider may talk with you about your daily activities, such as eating, sleeping, work, and recreation, and ask if you have had any changes in activity.  Your health care provider may ask you to see a mental health specialist, such as a psychiatrist or psychologist, for more evaluation. Who should be screened for depression?  All adults, including adults with a family history  of a mental health disorder.  Adolescents who are 12-18 years old.  People who are recovering from a myocardial infarction (MI).  Pregnant women, or women who have given birth.  People who have a long-term (chronic) illness.  Anyone who has been diagnosed with another type of a mental health disorder.  Anyone who has symptoms that could show depression. What do my results mean? Your health care provider will review the results of your depression screening, physical exam, and lab tests. Positive screens suggest that you may have depression. Screening is the first step in getting the care that you may need. It is up to you to get your screening results. Ask your health care provider, or the department that is doing your screening tests, when your results will be ready. Talk with your health care provider about your results and diagnosis. A diagnosis of depression is made using the Diagnostic and Statistical Manual of Mental Disorders (DSM-V). This is a book that lists the number and type of symptoms that must be present for a health care provider to give a specific diagnosis.  Your health care provider may work with you to treat your symptoms of depression, or your health care provider may help you find a mental health provider who can assess, diagnose, and treat your depression. Get help right away if:  You have thoughts about hurting yourself or others. If you ever feel like you may hurt yourself or others, or have thoughts about taking your own life, get help right away. You can   go to your nearest emergency department or call:  Your local emergency services (911 in the [place]  A suicide crisis helpline, such as the National Suicide Prevention Lifeline at 1-800-273-8255. This is open 24 hours a day.  Summary  Depression screening is the first step in getting the help that you may need.  If your screening test shows symptoms of depression (is positive), your health care provider may ask  you to see a mental health provider.  Anyone who is age 12 or older should be screened for depression. This information is not intended to replace advice given to you by your health care provider. Make sure you discuss any questions you have with your health care provider. Document Released: 08/01/2016 Document Revised: 08/01/2016 Document Reviewed: 08/01/2016 Elsevier Interactive Patient Education  2018 Elsevier Inc.  

## 2018-01-27 NOTE — Progress Notes (Signed)
  Medication Management Clinic Visit Note  Patient: Jasmine Edwards MRN: [id number] Date of Birth: 11/10/1990 PCP: Patient, No Pcp Per   [name] [place] Ugarte 27 y.o. female presents for medication follow up. 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 She states that she was feeling better but over the last 3-4 wks she feels like it is not working any more. She does not enjoy things she used to . She is over whelmed and just does not care what others think. She is tearful today.   BP 113/69   Pulse (!) 59   Ht 5\' 7"  (1.702 m)   Wt 155 lb 2 oz (70.4 kg)   LMP 01/26/2018 (Exact Date)   Breastfeeding? Yes   BMI 24.30 kg/m   Patient Information   Past Medical History:  Diagnosis Date  . Depression   . Diabetes, gestational    history      Past Surgical History:  Procedure Laterality Date  . FOOT SURGERY       Family History  Problem Relation Age of Onset  . Diabetes Maternal Aunt   . Hypertension Mother   . Migraines Mother   . Rashes / Skin problems Mother        Eczema    New Diagnoses (since last visit): none  Family Support: Comments:feels like her husband does not understand.   Social History   Substance and Sexual Activity  Alcohol Use No      Social History   Tobacco Use  Smoking Status Former Smoker  Smokeless Tobacco Never Used      Health Maintenance  Topic Date Due  . INFLUENZA VACCINE  01/28/2019 (Originally 10/29/2017)  . PAP SMEAR  08/09/2019  . TETANUS/TDAP  02/07/2027  . HIV Screening  Completed     Assessment and Plan:  Increased  Dose of Zoloft to 75 mg daily. Encouraged her to see counselor, order placed for referral. Discussed self help options, exercise , family help, and prioritizing what needs to be done and what she can let go of.  She is part of face book group that she can talk with other mothers and gets support from. She denies desire to hurt herself or her baby/family.Follow up in 6 wks for  medication check.   Doreene Burke, CNM

## 2018-03-16 ENCOUNTER — Encounter: Payer: Self-pay | Admitting: Certified Nurse Midwife

## 2018-03-16 ENCOUNTER — Ambulatory Visit (INDEPENDENT_AMBULATORY_CARE_PROVIDER_SITE_OTHER): Payer: BLUE CROSS/BLUE SHIELD | Admitting: Certified Nurse Midwife

## 2018-03-16 VITALS — BP 104/71 | HR 91 | Ht 67.0 in | Wt 149.5 lb

## 2018-03-16 DIAGNOSIS — Z79899 Other long term (current) drug therapy: Secondary | ICD-10-CM | POA: Diagnosis not present

## 2018-03-16 MED ORDER — SERTRALINE HCL 100 MG PO TABS: 100.0000 mg | ORAL_TABLET | Freq: Every day | ORAL | 5 refills | Status: DC

## 2018-03-16 NOTE — Progress Notes (Signed)
  Medication Management Clinic Visit Note  Patient: Jasmine Edwards MRN: [id number] Date of Birth: 11/17/1990 PCP: Patient, No Pcp Per   Jasmine Edwards 27 y.o. female presents for a medication visit today. She was placed on Zoloft for postpartum depression .  BP 104/71   Pulse 91   Ht 5\' 7"  (1.702 m)   Wt 149 lb 8 oz (67.8 kg)   Breastfeeding Yes   BMI 23.42 kg/m   Patient Information   Past Medical History:  Diagnosis Date  . Depression   . Diabetes, gestational    history      Past Surgical History:  Procedure Laterality Date  . FOOT SURGERY       Family History  Problem Relation Age of Onset  . Diabetes Maternal Aunt   . Hypertension Mother   . Migraines Mother   . Rashes / Skin problems Mother        Eczema    New Diagnoses (since last visit):  None  Family Support: Comments:husband      Social History   Substance and Sexual Activity  Alcohol Use No      Social History   Tobacco Use  Smoking Status Former Smoker  Smokeless Tobacco Never Used      Health Maintenance  Topic Date Due  . INFLUENZA VACCINE  01/28/2019 (Originally 10/29/2017)  . PAP-Cervical Cytology Screening  08/09/2019  . PAP SMEAR-Modifier  08/09/2019  . TETANUS/TDAP  02/07/2027  . HIV Screening  Completed     Assessment and Plan:  PHq9 11, score remains the same. Pt state she feels like she is doing better but not quite back to her normal self. 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 She state she is going to start to wean soon. She state  She missed 2 days of her medication and definitely noticed a difference in her mood. She states that her partner noticed and asked what was wrong with her then she recalled that she had forgotten her meds x 2 days. She would like to try 100 mg of the Zoloft. 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  She will return as  scheduled in several weeks for her annual exam.   Jasmine Edwards, CNM

## 2018-03-16 NOTE — Patient Instructions (Signed)
Persistent Depressive Disorder Persistent depressive disorder (PDD) is a mental health condition. PDD causes symptoms of low-level depression for 2 years or longer. It may also be called long-term (chronic) depression or dysthymia. PDD may include episodes of more severe depression that last for about 2 weeks (major depressive disorder or MDD). PDD can affect the way you think, feel, and sleep. This condition may also affect your relationships. You may be more likely to get sick if you have PDD. Symptoms of PDD occur for most of the day and may include:  Feeling tired (fatigue).  Low energy.  Eating too much or too little.  Sleeping too much or too little.  Feeling restless or agitated.  Feeling hopeless.  Feeling worthless or guilty.  Feeling worried or nervous (anxiety).  Trouble concentrating or making decisions.  Low self-esteem.  A negative way of looking at things (outlook).  Not being able to have fun or feel pleasure.  Avoiding interacting with people.  Getting angry or annoyed easily (irritability).  Acting aggressive or angry.  Follow these instructions at home: Activity  Go back to your normal activities as told by your doctor.  Exercise regularly as told by your doctor. General instructions  Take over-the-counter and prescription medicines only as told by your doctor.  Do not drink alcohol. Or, limit how much alcohol you drink to no more than 1 drink a day for nonpregnant women and 2 drinks a day for men. One drink equals 12 oz of beer, 5 oz of wine, or 1 oz of hard liquor. Alcohol can affect any antidepressant medicines you are taking. Talk with your doctor about your alcohol use.  Eat a healthy diet and get plenty of sleep.  Find activities that you enjoy each day.  Consider joining a support group. Your doctor may be able to suggest a support group.  Keep all follow-up visits as told by your doctor. This is important. Where to find more  information: National Alliance on Mental Illness  www.nami.org  [place] National Institute of Mental Health  www.nimh.nih.gov  National Suicide Prevention Lifeline  1-800-273-TALK (1-800-273-8255). This is free, 24-hour help.  Contact a doctor if:  Your symptoms get worse.  You have new symptoms.  You have trouble sleeping or doing your daily activities. Get help right away if:  You self-harm.  You have serious thoughts about hurting yourself or others.  You see, hear, taste, smell, or feel things that are not there (hallucinate). This information is not intended to replace advice given to you by your health care provider. Make sure you discuss any questions you have with your health care provider. Document Released: 02/26/2015 Document Revised: 11/09/2015 Document Reviewed: 11/09/2015 Elsevier Interactive Patient Education  2017 Elsevier Inc.  

## 2018-03-17 ENCOUNTER — Ambulatory Visit: Payer: Self-pay | Admitting: Adult Health

## 2018-03-17 ENCOUNTER — Encounter: Payer: Self-pay | Admitting: Adult Health

## 2018-03-17 VITALS — BP 114/69 | HR 89 | Temp 98.1°F | Resp 16 | Ht 67.0 in | Wt 151.0 lb

## 2018-03-17 DIAGNOSIS — H66001 Acute suppurative otitis media without spontaneous rupture of ear drum, right ear: Secondary | ICD-10-CM | POA: Insufficient documentation

## 2018-03-17 MED ORDER — AMOXICILLIN 875 MG PO TABS: 875.0000 mg | ORAL_TABLET | Freq: Two times a day (BID) | ORAL | 0 refills | Status: DC

## 2018-03-17 NOTE — Patient Instructions (Signed)
Amoxicillin; Clavulanic Acid tablets What is this medicine? AMOXICILLIN; CLAVULANIC ACID (a mox i SIL in; KLAV yoo lan ic AS id) is a penicillin antibiotic. It is used to treat certain kinds of bacterial infections. It will not work for colds, flu, or other viral infections. This medicine may be used for other purposes; ask your health care provider or pharmacist if you have questions. COMMON BRAND NAME(S): Augmentin What should I tell my health care provider before I take this medicine? They need to know if you have any of these conditions: -bowel disease, like colitis -kidney disease -liver disease -mononucleosis -an unusual or allergic reaction to amoxicillin, penicillin, cephalosporin, other antibiotics, clavulanic acid, other medicines, foods, dyes, or preservatives -pregnant or trying to get pregnant -breast-feeding How should I use this medicine? Take this medicine by mouth with a full glass of water. Follow the directions on the prescription label. Take at the start of a meal. Do not crush or chew. If the tablet has a score line, you may cut it in half at the score line for easier swallowing. Take your medicine at regular intervals. Do not take your medicine more often than directed. Take all of your medicine as directed even if you think you are better. Do not skip doses or stop your medicine early. Talk to your pediatrician regarding the use of this medicine in children. Special care may be needed. Overdosage: If you think you have taken too much of this medicine contact a poison control center or emergency room at once. NOTE: This medicine is only for you. Do not share this medicine with others. What if I miss a dose? If you miss a dose, take it as soon as you can. If it is almost time for your next dose, take only that dose. Do not take double or extra doses. What may interact with this medicine? -allopurinol -anticoagulants -birth control pills -methotrexate -probenecid This  list may not describe all possible interactions. Give your health care provider a list of all the medicines, herbs, non-prescription drugs, or dietary supplements you use. Also tell them if you smoke, drink alcohol, or use illegal drugs. Some items may interact with your medicine. What should I watch for while using this medicine? Tell your doctor or health care professional if your symptoms do not improve. Do not treat diarrhea with over the counter products. Contact your doctor if you have diarrhea that lasts more than 2 days or if it is severe and watery. If you have diabetes, you may get a false-positive result for sugar in your urine. Check with your doctor or health care professional. Birth control pills may not work properly while you are taking this medicine. Talk to your doctor about using an extra method of birth control. What side effects may I notice from receiving this medicine? Side effects that you should report to your doctor or health care professional as soon as possible: -allergic reactions like skin rash, itching or hives, swelling of the face, lips, or tongue -breathing problems -dark urine -fever or chills, sore throat -redness, blistering, peeling or loosening of the skin, including inside the mouth -seizures -trouble passing urine or change in the amount of urine -unusual bleeding, bruising -unusually weak or tired -white patches or sores in the mouth or throat Side effects that usually do not require medical attention (report to your doctor or health care professional if they continue or are bothersome): -diarrhea -dizziness -headache -nausea, vomiting -stomach upset -vaginal or anal irritation This list may   not describe all possible side effects. Call your doctor for medical advice about side effects. You may report side effects to FDA at 1-800-FDA-1088. Where should I keep my medicine? Keep out of the reach of children. Store at room temperature below 25 degrees  C (77 degrees F). Keep container tightly closed. Throw away any unused medicine after the expiration date. NOTE: This sheet is a summary. It may not cover all possible information. If you have questions about this medicine, talk to your doctor, pharmacist, or health care provider.  2019 Elsevier/Gold Standard (2007-06-10 12:04:30) Otitis Media, Adult  Otitis media means that the middle ear is red and swollen (inflamed) and full of fluid. The condition usually goes away on its own. Follow these instructions at home:  Take over-the-counter and prescription medicines only as told by your doctor.  If you were prescribed an antibiotic medicine, take it as told by your doctor. Do not stop taking the antibiotic even if you start to feel better.  Keep all follow-up visits as told by your doctor. This is important. Contact a doctor if:  You have bleeding from your nose.  There is a lump on your neck.  You are not getting better in 5 days.  You feel worse instead of better. Get help right away if:  You have pain that is not helped with medicine.  You have swelling, redness, or pain around your ear.  You get a stiff neck.  You cannot move part of your face (paralyzed).  You notice that the bone behind your ear hurts when you touch it.  You get a very bad headache. Summary  Otitis media means that the middle ear is red, swollen, and full of fluid.  This condition usually goes away on its own. In some cases, treatment may be needed.  If you were prescribed an antibiotic medicine, take it as told by your doctor. This information is not intended to replace advice given to you by your health care provider. Make sure you discuss any questions you have with your health care provider. Document Released: 09/03/2007 Document Revised: 04/07/2016 Document Reviewed: 04/07/2016 Elsevier Interactive Patient Education  2019 Elsevier Inc.  

## 2018-03-17 NOTE — Progress Notes (Addendum)
Subjective:     Patient ID: Justice RocherMeagan Brittany Lague, female   DOB: 06/15/1990, 27 y.o.   MRN: [id number]  HPI   Blood pressure 114/69, pulse 89, temperature 98.1 F (36.7 C), resp. rate 16, height 5\' 7"  (1.702 m), weight 151 lb (68.5 kg), SpO2 98 %, currently breastfeeding. Patient is a 27 year old female in no acute distress who comes to the clinic for complaints of sinus congestion, sore throat, nasal congestion started  03/16/18 yesterday.   Left ear stuffy feeling.   Her kids at home has had cough.   Denies any chance of pregnancy.  Lactation 4211 month old.   Patient  denies any fever, body aches,chills, rash, chest pain, shortness of breath, nausea, vomiting, or diarrhea.      No Known Allergies   Review of Systems  Constitutional: Positive for fatigue. Negative for activity change, appetite change, chills, diaphoresis, fever and unexpected weight change.  HENT: Positive for congestion, ear pain, postnasal drip, rhinorrhea and sore throat. Negative for dental problem, drooling, ear discharge, facial swelling, hearing loss, mouth sores, nosebleeds, sinus pressure, sinus pain, sneezing, tinnitus and trouble swallowing.   Respiratory: Negative.   Cardiovascular: Negative.  Negative for chest pain, palpitations and leg swelling.  Gastrointestinal: Negative.   Genitourinary: Negative.   Musculoskeletal: Negative.   Skin: Negative.   Neurological: Negative.   Hematological: Negative.   Psychiatric/Behavioral: Negative.        Objective:   Physical Exam Vitals signs and nursing note reviewed.  Constitutional:      General: She is not in acute distress.    Appearance: Normal appearance. She is normal weight. She is not ill-appearing, toxic-appearing or diaphoretic.  HENT:     Head: Normocephalic and atraumatic.     Right Ear: Ear canal and external ear normal. A middle ear effusion is present. There is no impacted cerumen. Tympanic membrane is erythematous. Tympanic  membrane is not perforated.     Left Ear: Tympanic membrane, ear canal and external ear normal.  No middle ear effusion. There is no impacted cerumen. Tympanic membrane is not erythematous.     Nose: Congestion and rhinorrhea present.     Right Sinus: No maxillary sinus tenderness or frontal sinus tenderness.     Left Sinus: No maxillary sinus tenderness or frontal sinus tenderness.     Mouth/Throat:     Mouth: Mucous membranes are moist.     Pharynx: Oropharynx is clear. No oropharyngeal exudate or posterior oropharyngeal erythema.     Tonsils: Swelling: 1+ on the right. 1+ on the left.  Eyes:     Extraocular Movements: Extraocular movements intact.     Conjunctiva/sclera: Conjunctivae normal.     Pupils: Pupils are equal, round, and reactive to light.  Neck:     Musculoskeletal: Normal range of motion and neck supple.  Cardiovascular:     Rate and Rhythm: Normal rate and regular rhythm.  Pulmonary:     Effort: Pulmonary effort is normal.     Breath sounds: Normal breath sounds.  Abdominal:     General: Abdomen is flat.  Musculoskeletal: Normal range of motion.  Skin:    General: Skin is warm.  Neurological:     General: No focal deficit present.     Mental Status: She is alert.  Psychiatric:        Mood and Affect: Mood normal.        Behavior: Behavior normal.        Thought Content: Thought content normal.  Judgment: Judgment normal.        Assessment:     Non-recurrent acute suppurative otitis media of right ear without spontaneous rupture of tympanic membrane      Plan:     Meds ordered this encounter  Medications  . amoxicillin (AMOXIL) 875 MG tablet    Sig: Take 1 tablet (875 mg total) by mouth 2 (two) times daily.    Dispense:  20 tablet    Refill:  0    Advised patient call the office or your primary care doctor for an appointment if no improvement within 72 hours or if any symptoms change or worsen at any time  Advised ER or urgent Care if after  hours or on weekend. Call 911 for emergency symptoms at any time.Patinet verbalized understanding of all instructions given/reviewed and treatment plan and has no further questions or concerns at this time.    Patient verbalized understanding of all instructions given and denies any further questions at this time.

## 2018-04-09 ENCOUNTER — Other Ambulatory Visit: Payer: Self-pay | Admitting: Certified Nurse Midwife

## 2018-06-01 ENCOUNTER — Other Ambulatory Visit: Payer: Self-pay | Admitting: Certified Nurse Midwife

## 2018-06-01 DIAGNOSIS — F329 Major depressive disorder, single episode, unspecified: Secondary | ICD-10-CM

## 2018-06-01 DIAGNOSIS — F32A Depression, unspecified: Secondary | ICD-10-CM

## 2018-06-01 NOTE — Progress Notes (Signed)
Pt continues to struggle with derepression. She has reached out requesting further evaluation and therapy. Referral placed.   Doreene Burke, CNM

## 2018-06-11 ENCOUNTER — Telehealth: Payer: Self-pay

## 2018-06-11 NOTE — Telephone Encounter (Signed)
Pt called to report she missed a period LMP was in Feb and she had a positive home pregnancy test. She states she is having left side pain. Denies spotting or cramping. Due to being so early and the pregnancy is unconfirmed, pt decided to wait until her scheduled appointment to discuss issues with provider. Encouraged pt to seek care if she has bleeding or cramping. She expressed understanding.

## 2018-06-28 ENCOUNTER — Telehealth: Payer: Self-pay | Admitting: Certified Nurse Midwife

## 2018-06-28 ENCOUNTER — Other Ambulatory Visit: Payer: Self-pay

## 2018-06-28 ENCOUNTER — Telehealth: Payer: Self-pay

## 2018-06-28 ENCOUNTER — Emergency Department: Payer: BLUE CROSS/BLUE SHIELD

## 2018-06-28 ENCOUNTER — Encounter: Payer: Self-pay | Admitting: Emergency Medicine

## 2018-06-28 ENCOUNTER — Emergency Department
Admission: EM | Admit: 2018-06-28 | Discharge: 2018-06-28 | Disposition: A | Discharge status: Home / Self Care | Payer: BLUE CROSS/BLUE SHIELD | Attending: Emergency Medicine | Admitting: Emergency Medicine

## 2018-06-28 DIAGNOSIS — Z79899 Other long term (current) drug therapy: Secondary | ICD-10-CM | POA: Diagnosis not present

## 2018-06-28 DIAGNOSIS — Z3A01 Less than 8 weeks gestation of pregnancy: Secondary | ICD-10-CM | POA: Diagnosis not present

## 2018-06-28 DIAGNOSIS — O209 Hemorrhage in early pregnancy, unspecified: Secondary | ICD-10-CM | POA: Diagnosis present

## 2018-06-28 DIAGNOSIS — Z87891 Personal history of nicotine dependence: Secondary | ICD-10-CM | POA: Insufficient documentation

## 2018-06-28 DIAGNOSIS — O200 Threatened abortion: Secondary | ICD-10-CM | POA: Insufficient documentation

## 2018-06-28 LAB — URINALYSIS, COMPLETE (UACMP) WITH MICROSCOPIC
Bacteria, UA: NONE SEEN
Bilirubin Urine: NEGATIVE
GLUCOSE, UA: NEGATIVE mg/dL
Ketones, ur: NEGATIVE mg/dL
Leukocytes,Ua: NEGATIVE
NITRITE: NEGATIVE
Protein, ur: NEGATIVE mg/dL
Specific Gravity, Urine: 1.002 — ABNORMAL LOW (ref 1.005–1.030)
Squamous Epithelial / HPF: NONE SEEN (ref 0–5)
pH: 7 (ref 5.0–8.0)

## 2018-06-28 LAB — CBC
HCT: 39.5 % (ref 36.0–46.0)
Hemoglobin: 13.2 g/dL (ref 12.0–15.0)
MCH: 29.6 pg (ref 26.0–34.0)
MCHC: 33.4 g/dL (ref 30.0–36.0)
MCV: 88.6 fL (ref 80.0–100.0)
Platelets: 203 10*3/uL (ref 150–400)
RBC: 4.46 MIL/uL (ref 3.87–5.11)
RDW: 11.5 % (ref 11.5–15.5)
WBC: 6.7 10*3/uL (ref 4.0–10.5)
nRBC: 0 % (ref 0.0–0.2)

## 2018-06-28 LAB — HCG, QUANTITATIVE, PREGNANCY: hCG, Beta Chain, Quant, S: 1237 m[IU]/mL — ABNORMAL HIGH (ref <5)

## 2018-06-28 LAB — ANTIBODY SCREEN: Antibody Screen: NEGATIVE

## 2018-06-28 LAB — ABO/RH: ABO/RH(D): O NEG

## 2018-06-28 MED ORDER — RHO D IMMUNE GLOBULIN 1500 UNIT/2ML IJ SOSY
300.0000 ug | PREFILLED_SYRINGE | Freq: Once | INTRAMUSCULAR | Status: AC
  Administered 2018-06-28: 300 ug via INTRAMUSCULAR
  Filled 2018-06-28: qty 2

## 2018-06-28 NOTE — ED Triage Notes (Signed)
Pt reports is [redacted] weeks pregnant and has come abd cramping and vaginal bleeding. Pt sates she called her OB and was advised to come to the ED. Pt states this is her 3rd pregnancy and she has a hx of a subchorionic hemorrhage with one of her pregnancies.

## 2018-06-28 NOTE — ED Notes (Signed)
First Nurse Note: Patient states she is [redacted] weeks pregnant and was told to come to the ED because of vaginal bleeding.  NAD, placed in WC.  Color good.

## 2018-06-28 NOTE — Telephone Encounter (Signed)
Mychart message sent.

## 2018-06-28 NOTE — ED Provider Notes (Signed)
[facility] Emergency Department Provider Note   ____________________________________________   First MD Initiated Contact with Patient 06/28/18 [phone]     (approximate)  I have reviewed the triage vital signs and the nursing notes.   HISTORY  Chief Complaint Vaginal Bleeding and Abdominal Cramping    HPI Jasmine Edwards is a 28 y.o. female reports that she is just short of [redacted] weeks pregnant, last menstrual cycle started February 5  She has had positive pregnancy test.  She reports she started to have a little bit of crampy discomfort in her very low pelvis as well as small amount of vaginal bleeding that she describes as similar to a menstrual cycle or spotting  No fevers or chills.  No nausea vomiting.  Currently not in any pain, reports that just feels a little tender and she was having some cramps earlier  Called her doctor's office, she again follows with [facility].  They recommend she come to the ER to have an ultrasound  No recent illness.  Reports this pregnancy is gone very well.  No fertility treatments.  No history of ectopic pregnancy.   Past Medical History:  Diagnosis Date  . Depression   . Diabetes, gestational    history    Patient Active Problem List   Diagnosis Date Noted  . Non-recurrent acute suppurative otitis media of right ear without spontaneous rupture of tympanic membrane 03/17/2018  . Allergic rhinitis 09/19/2016    Past Surgical History:  Procedure Laterality Date  . FOOT SURGERY      Prior to Admission medications   Medication Sig Start Date End Date Taking? Authorizing Provider  amoxicillin (AMOXIL) 875 MG tablet Take 1 tablet (875 mg total) by mouth 2 (two) times daily. 03/17/18   Flinchum, Eula Fried, FNP  Drospirenone (SLYND) 4 MG TABS Take 1 tablet by mouth daily. Patient not taking: Reported on 03/17/2018 01/27/18   Doreene Burke, CNM  Prenatal Vit-Fe Fumarate-FA (PRENATAL  MULTIVITAMIN) TABS tablet Take 1 tablet by mouth daily at 12 noon.    [provider]  sertraline (ZOLOFT) 100 MG tablet Take 1 tablet (100 mg total) by mouth daily. 03/16/18   Doreene Burke, CNM    Allergies Patient has no known allergies.  Family History  Problem Relation Age of Onset  . Diabetes Maternal Aunt   . Hypertension Mother   . Migraines Mother   . Rashes / Skin problems Mother        Eczema    Social History Social History   Tobacco Use  . Smoking status: Former Games developer  . Smokeless tobacco: Never Used  Substance Use Topics  . Alcohol use: No  . Drug use: No    Review of Systems Constitutional: No fever/chills Eyes: No visual changes. ENT: No sore throat. Cardiovascular: Denies chest pain. Respiratory: Denies shortness of breath. Gastrointestinal: No abdominal pain suffer some slight cramping of the uterus that she had earlier.   Genitourinary: Negative for dysuria. Musculoskeletal: Negative for back pain. Skin: Negative for rash. Neurological: Negative for headaches, areas of focal weakness or numbness.    ____________________________________________   PHYSICAL EXAM:  VITAL SIGNS: ED Triage Vitals  Enc Vitals Group     BP 06/28/18 0912 129/78     Pulse Rate 06/28/18 0912 (!) 105     Resp 06/28/18 0912 18     Temp 06/28/18 0912 98.2 F (36.8 C)     Temp Source 06/28/18 0912 Oral     SpO2  06/28/18 0912 100 %     Weight 06/28/18 0914 155 lb (70.3 kg)     Height 06/28/18 0914  (1.702 m)     Head Circumference --      Peak Flow --      Pain Score 06/28/18 0916 5     Pain Loc --      Pain Edu? --      Excl. in GC? --     Constitutional: Alert and oriented. Well appearing and in no acute distress. Eyes: Conjunctivae are normal. Head: Atraumatic. Nose: No congestion/rhinnorhea. Mouth/Throat: Mucous membranes are moist. Neck: No stridor.  Cardiovascular: Normal rate, regular rhythm. Good peripheral circulation. Respiratory:  Normal respiratory effort.  No retractions. Gastrointestinal: Soft and nontender except for some just minimal discomfort in the suprapubic region without any rebound or guarding.  No pain at McBurney's point.  Negative Murphy.. No distention. Musculoskeletal: No lower extremity tenderness nor edema. Neurologic:  Normal speech and language. No gross focal neurologic deficits are appreciated.  Skin:  Skin is warm, dry and intact. No rash noted. Psychiatric: Mood and affect are normal. Speech and behavior are normal.  ____________________________________________   LABS (all labs ordered are listed, but only abnormal results are displayed)  Labs Reviewed  HCG, QUANTITATIVE, PREGNANCY - Abnormal; Notable for the following components:      Result Value   hCG, Beta Chain, Quant, S 1,237 (*)    All other components within normal limits  URINALYSIS, COMPLETE (UACMP) WITH MICROSCOPIC - Abnormal; Notable for the following components:   Color, Urine COLORLESS (*)    APPearance CLEAR (*)    Specific Gravity, Urine 1.002 (*)    Hgb urine dipstick SMALL (*)    All other components within normal limits  CBC  ABO/RH  ANTIBODY SCREEN  RHOGAM INJECTION   ____________________________________________  EKG   ____________________________________________  RADIOLOGY  US Ob Comp < 14 Wks  Result Date: 06/28/2018 CLINICAL DATA:  Pregnant patient with vaginal bleeding since yesterday. Quantitative HCG 1,237. EXAM: OBSTETRIC <14 WK [place] AND TRANSVAGINAL OB US TECHNIQUE: Both transabdominal and transvaginal ultrasound examinations were performed for complete evaluation of the gestation as well as the maternal uterus, adnexal regions, and pelvic cul-de-sac. Transvaginal technique was performed to assess early pregnancy. COMPARISON:  None. FINDINGS: Intrauterine gestational sac: Not visualized. Endometrial stripe thickness is 1.1 cm Yolk sac:  Not applicable. Embryo:  Not applicable. Cardiac Activity: Not  applicable. Subchorionic hemorrhage:  Not applicable. Maternal uterus/adnexae: Appear normal. IMPRESSION: No ultrasound evidence of pregnancy is identified could be due to early gestational age or abortion in progress given the patient's vaginal bleeding. Recommend follow-up serial quantitative HCG. Electronically Signed   By: Drusilla Kanner M.D.   On: 06/28/2018 11:05   US Ob Transvaginal  Result Date: 06/28/2018 CLINICAL DATA:  Pregnant patient with vaginal bleeding since yesterday. Quantitative HCG 1,237. EXAM: OBSTETRIC <14 WK [place] AND TRANSVAGINAL OB US TECHNIQUE: Both transabdominal and transvaginal ultrasound examinations were performed for complete evaluation of the gestation as well as the maternal uterus, adnexal regions, and pelvic cul-de-sac. Transvaginal technique was performed to assess early pregnancy. COMPARISON:  None. FINDINGS: Intrauterine gestational sac: Not visualized. Endometrial stripe thickness is 1.1 cm Yolk sac:  Not applicable. Embryo:  Not applicable. Cardiac Activity: Not applicable. Subchorionic hemorrhage:  Not applicable. Maternal uterus/adnexae: Appear normal. IMPRESSION: No ultrasound evidence of pregnancy is identified could be due to early gestational age or abortion in progress given the patient's vaginal bleeding. Recommend follow-up serial quantitative  HCG. Electronically Signed   By: Drusilla Kanner M.D.   On: 06/28/2018 11:05    Ultrasound results reviewed by me.  Also reviewed by Dr. Valentino Saxon whom I discussed with ____________________________________________   PROCEDURES  Procedure(s) performed: None  Procedures  Critical Care performed: No  ____________________________________________   INITIAL IMPRESSION / ASSESSMENT AND PLAN / ED COURSE  Pertinent labs & imaging results that were available during my care of the patient were reviewed by me and considered in my medical decision making (see chart for details).   Spotting and some cramping, just  short of 8 weeks pregnancy.  And no signs or symptoms based on clinical examination that would suggest this to be an obvious ectopic.  Reassuring clinical examination.  Bleeding consistent with a menstrual cycle without report of any significant heavy bleeding.  She is very comfortable at this point with minimal tenderness on exam.  Discussed with [facility], Dr. Valentino Saxon.  RhoGam will give be given and we will await ultrasound for further evaluation.  No clinical signs or symptoms of suggest an acute abdomen or infectious etiology at this time.  Clinical Course as of Jun 27 1256  Mon Jun 28, 2018  1027 Labs reviewed, hCG seems just slightly low compared to what I would necessarily expect at almost 8 weeks pregnancy.  Dr. Valentino Saxon has been contacted at this time.  Await ultrasound results   [MQ]  1244 Case, care, clinical evaluation/all results reviewed by Dr. Valentino Saxon. Advises return to [facility] this Wednesday for reevaluation and repeat bHCG. Threatened miscarriage.    [MQ]    Clinical Course User Index [MQ] Sharyn Creamer, MD    ----------------------------------------- 12:57 PM on 06/28/2018 -----------------------------------------  Patient resting.  Reports she is passed just small amount of clots since ultrasound having some cramps that feel like a menstrual cycle.  She is awake alert with normal vital signs well-appearing without any abdominal distention or peritonitis.  Does not appear consistent with an obvious ectopic type situation I suspect a threatened miscarriage.  Patient has appointment set up for Wednesday with [facility] for follow-up.  She will follow-up with that, careful return precautions discussed.  Return precautions and treatment recommendations and follow-up discussed with the patient who is agreeable with the plan.   ____________________________________________   FINAL CLINICAL IMPRESSION(S) / ED DIAGNOSES  Final diagnoses:  Threatened miscarriage        Note:   This document was prepared using Dragon voice recognition software and may include unintentional dictation errors       Sharyn Creamer, MD 06/28/18 1258

## 2018-06-28 NOTE — Telephone Encounter (Signed)
The patient called and stated that she is having bleeding and is very concerned. Pt requested to speak with a nurse this morning. Please advise.

## 2018-06-29 LAB — RHOGAM INJECTION: Unit division: 0

## 2018-06-30 ENCOUNTER — Encounter: Payer: BLUE CROSS/BLUE SHIELD | Admitting: Certified Nurse Midwife

## 2018-06-30 ENCOUNTER — Telehealth: Payer: Self-pay

## 2018-06-30 NOTE — Telephone Encounter (Signed)
Pt was called to see if she could come in for HCG blood work following up after a hospital visit. Pt stated that she already know that her baby is not longer there by the amount of blood that she has lost. Pt also stated that at her first u/s they saw and heard a baby and at the hospital they heard and saw nothing. Pt stated that she just didn't want to come in due feeling like it was not worth it at all.

## 2018-09-08 ENCOUNTER — Ambulatory Visit (INDEPENDENT_AMBULATORY_CARE_PROVIDER_SITE_OTHER): Payer: BC Managed Care – PPO | Admitting: Certified Nurse Midwife

## 2018-09-08 ENCOUNTER — Other Ambulatory Visit: Payer: Self-pay

## 2018-09-08 ENCOUNTER — Encounter: Payer: Self-pay | Admitting: Certified Nurse Midwife

## 2018-09-08 VITALS — BP 115/67 | HR 75 | Ht 67.0 in | Wt 159.2 lb

## 2018-09-08 DIAGNOSIS — Z3202 Encounter for pregnancy test, result negative: Secondary | ICD-10-CM | POA: Diagnosis not present

## 2018-09-08 DIAGNOSIS — N912 Amenorrhea, unspecified: Secondary | ICD-10-CM | POA: Diagnosis not present

## 2018-09-08 LAB — POCT URINE PREGNANCY: Preg Test, Ur: NEGATIVE

## 2018-09-08 NOTE — Progress Notes (Signed)
Subjective:    Jasmine Edwards is a 28 y.o. female who presents for evaluation of  A positive home pregnancy test. She states she was not feeling right and decided to do a home test.. She believes she could be pregnant. Pregnancy is desired. Sexual Activity: single partner, contraception: none. Current symptoms also include: fatigue and positive home pregnancy test. Last period was normal.   Patient's last menstrual period was 08/12/2018 (exact date). The following portions of the patient's history were reviewed and updated as appropriate: allergies, current medications, past family history, past medical history, past social history, past surgical history and problem list.  Review of Systems Pertinent items are noted in HPI.     Objective:    BP 115/67   Pulse 75   Ht 5\' 7"  (1.702 m)   Wt 159 lb 4 oz (72.2 kg)   LMP 08/12/2018 (Exact Date)   Breastfeeding Yes   BMI 24.94 kg/m  General: alert, cooperative, appears stated age, fatigued and no acute distress    Lab Review Urine HCG: was initially negative, after sitting a faint positive seen      Assessment:    Absence of menstruation.     Plan:    Pregnancy Test: plan of Beta Hcg today and repeat on Friday.   Pt request note for work , to allow her to remain working at home due to [employer]. Will complete letter for employer once Beta Hcg confirm pregnancy. Discussed u/s for dating around 7-8 wks , 10 wk nurse visit, and 12 NOB. She verbalizes and agrees to plan of care. Follow up Friday for lab testing.   I attest more than 50% of visit spent reviewing history, discussing UPT at today's visit,  and developing a plan of care with pt. Face to face time 10 min.   Philip Aspen, CNM

## 2018-09-08 NOTE — Patient Instructions (Signed)

## 2018-09-09 ENCOUNTER — Telehealth: Payer: Self-pay

## 2018-09-09 LAB — BETA HCG QUANT (REF LAB): hCG Quant: 53 m[IU]/mL

## 2018-09-09 NOTE — Telephone Encounter (Signed)
Coronavirus (COVID-19) Are you at risk?  Are you at risk for the Coronavirus (COVID-19)?  To be considered HIGH RISK for Coronavirus (COVID-19), you have to meet the following criteria:  . Traveled to [place], [place], [place], [place] or [place]; or in the [place] to [place], [place], [place], or [place]; and have fever, cough, and shortness of breath within the last 2 weeks of travel OR . Been in close contact with a person diagnosed with COVID-19 within the last 2 weeks and have fever, cough, and shortness of breath . IF YOU DO NOT MEET THESE CRITERIA, YOU ARE CONSIDERED LOW RISK FOR COVID-19.  What to do if you are HIGH RISK for COVID-19?  . If you are having a medical emergency, call 911. . Seek medical care right away. Before you go to a doctor's office, urgent care or emergency department, call ahead and tell them about your recent travel, contact with someone diagnosed with COVID-19, and your symptoms. You should receive instructions from your physician's office regarding next steps of care.  . When you arrive at healthcare provider, tell the healthcare staff immediately you have returned from visiting [place], [place], [place], [place] or [place]; or traveled in the [place] to [place], [place], [place], or [place]; in the last two weeks or you have been in close contact with a person diagnosed with COVID-19 in the last 2 weeks.   . Tell the health care staff about your symptoms: fever, cough and shortness of breath. . After you have been seen by a medical provider, you will be either: o Tested for (COVID-19) and discharged home on quarantine except to seek medical care if symptoms worsen, and asked to  - Stay home and avoid contact with others until you get your results (4-5 days)  - Avoid travel on public transportation if possible (such as bus, train, or airplane) or o Sent to the Emergency Department by EMS for evaluation, COVID-19 testing, and possible  admission depending on your condition and test results.  What to do if you are LOW RISK for COVID-19?  Reduce your risk of any infection by using the same precautions used for avoiding the common cold or flu:  . Wash your hands often with soap and warm water for at least 20 seconds.  If soap and water are not readily available, use an alcohol-based hand sanitizer with at least 60% alcohol.  . If coughing or sneezing, cover your mouth and nose by coughing or sneezing into the elbow areas of your shirt or coat, into a tissue or into your sleeve (not your hands). . Avoid shaking hands with others and consider head nods or verbal greetings only. . Avoid touching your eyes, nose, or mouth with unwashed hands.  . Avoid close contact with people who are sick. . Avoid places or events with large numbers of people in one location, like concerts or sporting events. . Carefully consider travel plans you have or are making. . If you are planning any travel outside or inside the [place], visit the CDC's Travelers' Health webpage for the latest health notices. . If you have some symptoms but not all symptoms, continue to monitor at home and seek medical attention if your symptoms worsen. . If you are having a medical emergency, call 911.   ADDITIONAL HEALTHCARE OPTIONS FOR PATIENTS  [facility] / e-Visit: [email or web address]         [facility]: [phone]  [facility]: [phone]                   [facility]: [phone]   Prescreened. Neg .cm 

## 2018-09-10 ENCOUNTER — Other Ambulatory Visit: Payer: BC Managed Care – PPO

## 2018-09-10 ENCOUNTER — Other Ambulatory Visit: Payer: Self-pay

## 2018-09-10 DIAGNOSIS — N912 Amenorrhea, unspecified: Secondary | ICD-10-CM

## 2018-09-11 LAB — BETA HCG QUANT (REF LAB): hCG Quant: 126 m[IU]/mL

## 2018-09-28 ENCOUNTER — Other Ambulatory Visit: Payer: Self-pay | Admitting: Certified Nurse Midwife

## 2018-09-28 DIAGNOSIS — Z3491 Encounter for supervision of normal pregnancy, unspecified, first trimester: Secondary | ICD-10-CM

## 2018-09-30 ENCOUNTER — Other Ambulatory Visit: Payer: Self-pay | Admitting: Certified Nurse Midwife

## 2018-09-30 ENCOUNTER — Other Ambulatory Visit: Payer: Self-pay

## 2018-09-30 ENCOUNTER — Ambulatory Visit (INDEPENDENT_AMBULATORY_CARE_PROVIDER_SITE_OTHER): Payer: BC Managed Care – PPO

## 2018-09-30 DIAGNOSIS — Z3687 Encounter for antenatal screening for uncertain dates: Secondary | ICD-10-CM | POA: Diagnosis not present

## 2018-09-30 DIAGNOSIS — Z3491 Encounter for supervision of normal pregnancy, unspecified, first trimester: Secondary | ICD-10-CM

## 2018-11-10 ENCOUNTER — Other Ambulatory Visit: Payer: Self-pay

## 2018-11-12 ENCOUNTER — Other Ambulatory Visit: Payer: Self-pay

## 2018-11-12 ENCOUNTER — Ambulatory Visit (INDEPENDENT_AMBULATORY_CARE_PROVIDER_SITE_OTHER): Payer: BC Managed Care – PPO | Admitting: Certified Nurse Midwife

## 2018-11-12 ENCOUNTER — Encounter: Payer: Self-pay | Admitting: Certified Nurse Midwife

## 2018-11-12 VITALS — BP 104/81 | HR 81 | Wt 156.0 lb

## 2018-11-12 DIAGNOSIS — Z3A12 12 weeks gestation of pregnancy: Secondary | ICD-10-CM

## 2018-11-12 DIAGNOSIS — Z3482 Encounter for supervision of other normal pregnancy, second trimester: Secondary | ICD-10-CM

## 2018-11-12 MED ORDER — DOXYLAMINE-PYRIDOXINE 10-10 MG PO TBEC: 1.0000 | DELAYED_RELEASE_TABLET | Freq: Four times a day (QID) | ORAL | 5 refills | Status: DC

## 2018-11-12 NOTE — Progress Notes (Signed)
NEW OB HISTORY AND PHYSICAL  SUBJECTIVE:       Jasmine Berniece SalinesBrittany Miyamoto is a 28 y.o. [id number] female, Patient's last menstrual period was 08/12/2018 (exact date)., Estimated Date of Delivery: 05/23/19, 861w4d, presents today for establishment of Prenatal Care. She complains of nausea.     Gynecologic History Patient's last menstrual period was 08/12/2018 (exact date). Normal Contraception: none Last Pap: 08/08/16. Results were: normal  Obstetric History OB History  Gravida Para Term Preterm AB Living  4 2 2   1 2   SAB TAB Ectopic Multiple Live Births  1     0 2    # Outcome Date GA Lbr Len/2nd Weight Sex Delivery Anes PTL Lv  4 Current           3 SAB 06/03/18 4290w0d         2 Term 04/20/17 75102w2d / 00:23 7 lb 3 oz (3.26 kg) F Vag-Spont EPI  LIV  1 Term 10/26/13 7550w0d  7 lb 7 oz (3.374 kg) M Vag-Spont  Y LIV    Obstetric Comments  G1 - GDM (diet controlled). PTL at 28 weeks, arrested. IOL at 40 weeks.     Past Medical History:  Diagnosis Date  . Depression   . Diabetes, gestational    history    Past Surgical History:  Procedure Laterality Date  . FOOT SURGERY      Current Outpatient Medications on File Prior to Visit  Medication Sig Dispense Refill  . Prenatal Vit-Fe Fumarate-FA (PRENATAL MULTIVITAMIN) TABS tablet Take 1 tablet by mouth daily at 12 noon.     No current facility-administered medications on file prior to visit.     No Known Allergies  Social History   Socioeconomic History  . Marital status: Single    Spouse name: Not on file  . Number of children: Not on file  . Years of education: Not on file  . Highest education level: Not on file  Occupational History  . Not on file  Social Needs  . Financial resource strain: Not on file  . Food insecurity    Worry: Not on file    Inability: Not on file  . Transportation needs    Medical: Not on file    Non-medical: Not on file  Tobacco Use  . Smoking status: Former Games developermoker  . Smokeless tobacco: Never  Used  Substance and Sexual Activity  . Alcohol use: No  . Drug use: No  . Sexual activity: Yes    Partners: Male    Birth control/protection: None  Lifestyle  . Physical activity    Days per week: Not on file    Minutes per session: Not on file  . Stress: Not on file  Relationships  . Social Musicianconnections    Talks on phone: Not on file    Gets together: Not on file    Attends religious service: Not on file    Active member of club or organization: Not on file    Attends meetings of clubs or organizations: Not on file    Relationship status: Not on file  . Intimate partner violence    Fear of current or ex partner: Not on file    Emotionally abused: Not on file    Physically abused: Not on file    Forced sexual activity: Not on file  Other Topics Concern  . Not on file  Social History Narrative  . Not on file    Family History  Problem Relation Age  of Onset  . Diabetes Maternal Aunt   . Hypertension Mother   . Migraines Mother   . Rashes / Skin problems Mother        Eczema    The following portions of the patient's history were reviewed and updated as appropriate: allergies, current medications, past OB history, past medical history, past surgical history, past family history, past social history, and problem list.    OBJECTIVE: Initial Physical Exam (New OB)  GENERAL APPEARANCE: alert, well appearing, in no apparent distress, oriented to person, place and time HEAD: normocephalic, atraumatic MOUTH: mucous membranes moist, pharynx normal without lesions THYROID: no thyromegaly or masses present BREASTS: no masses noted, no significant tenderness, no palpable axillary nodes, no skin changes LUNGS: clear to auscultation, no wheezes, rales or rhonchi, symmetric air entry HEART: regular rate and rhythm, no murmurs ABDOMEN: soft, nontender, nondistended, no abnormal masses, no epigastric pain EXTREMITIES: no redness or tenderness in the calves or thighs SKIN: normal  coloration and turgor, no rashes LYMPH NODES: no adenopathy palpable NEUROLOGIC: alert, oriented, normal speech, no focal findings or movement disorder noted  PELVIC EXAM EXTERNAL GENITALIA: normal appearing vulva with no masses, tenderness or lesions VAGINA: no abnormal discharge or lesions CERVIX: no lesions or cervical motion tenderness UTERUS: gravid ADNEXA: no masses palpable and nontender OB EXAM PELVIMETRY: appears adequate RECTUM: exam not indicated  ASSESSMENT: Normal pregnancy  PLAN:  OB counseling: The patient has been given an overview regarding routine prenatal care. Recommendations regarding diet, weight gain, and exercise in pregnancy were given. Prenatal testing, optional genetic testing,carrier screening, and ultrasound use in pregnancy were reviewed.  Panorama testing today. Diclegis ordered.  Benefits of Breast Feeding were discussed. The patient is encouraged to consider nursing her baby post partum.  Philip Aspen, CNM

## 2018-11-13 LAB — CBC WITH DIFFERENTIAL
Basophils Absolute: 0 10*3/uL (ref 0.0–0.2)
Basos: 1 %
EOS (ABSOLUTE): 0.1 10*3/uL (ref 0.0–0.4)
Eos: 1 %
Hematocrit: 37.4 % (ref 34.0–46.6)
Hemoglobin: 12.7 g/dL (ref 11.1–15.9)
Immature Grans (Abs): 0 10*3/uL (ref 0.0–0.1)
Immature Granulocytes: 0 %
Lymphocytes Absolute: 1.5 10*3/uL (ref 0.7–3.1)
Lymphs: 20 %
MCH: 30.3 pg (ref 26.6–33.0)
MCHC: 34 g/dL (ref 31.5–35.7)
MCV: 89 fL (ref 79–97)
Monocytes Absolute: 0.4 10*3/uL (ref 0.1–0.9)
Monocytes: 6 %
Neutrophils Absolute: 5.5 10*3/uL (ref 1.4–7.0)
Neutrophils: 72 %
RBC: 4.19 x10E6/uL (ref 3.77–5.28)
RDW: 11.6 % — ABNORMAL LOW (ref 11.7–15.4)
WBC: 7.6 10*3/uL (ref 3.4–10.8)

## 2018-11-13 LAB — URINALYSIS, ROUTINE W REFLEX MICROSCOPIC
Bilirubin, UA: NEGATIVE
Glucose, UA: NEGATIVE
Ketones, UA: NEGATIVE
Leukocytes,UA: NEGATIVE
Nitrite, UA: NEGATIVE
Protein,UA: NEGATIVE
RBC, UA: NEGATIVE
Specific Gravity, UA: 1.005 (ref 1.005–1.030)
Urobilinogen, Ur: 0.2 mg/dL (ref 0.2–1.0)
pH, UA: 6.5 (ref 5.0–7.5)

## 2018-11-13 LAB — ANTIBODY SCREEN: Antibody Screen: NEGATIVE

## 2018-11-13 LAB — VARICELLA ZOSTER ANTIBODY, IGG: Varicella zoster IgG: 301 index (ref >165)

## 2018-11-13 LAB — ABO AND RH: Rh Factor: NEGATIVE

## 2018-11-13 LAB — HEPATITIS B SURFACE ANTIGEN: Hepatitis B Surface Ag: NEGATIVE

## 2018-11-13 LAB — RPR: RPR Ser Ql: NONREACTIVE

## 2018-11-13 LAB — RUBELLA SCREEN: Rubella Antibodies, IGG: 1 index (ref >0.99)

## 2018-11-14 LAB — URINE CULTURE: Organism ID, Bacteria: NO GROWTH

## 2018-11-17 LAB — GC/CHLAMYDIA PROBE AMP
Chlamydia trachomatis, NAA: NEGATIVE
Neisseria Gonorrhoeae by PCR: NEGATIVE

## 2018-12-10 ENCOUNTER — Encounter: Payer: BC Managed Care – PPO | Admitting: Obstetrics and Gynecology

## 2018-12-10 ENCOUNTER — Encounter: Payer: BC Managed Care – PPO | Admitting: Certified Nurse Midwife

## 2018-12-16 ENCOUNTER — Encounter: Payer: BC Managed Care – PPO | Admitting: Certified Nurse Midwife

## 2018-12-17 ENCOUNTER — Encounter: Payer: BC Managed Care – PPO | Admitting: Certified Nurse Midwife

## 2018-12-20 ENCOUNTER — Ambulatory Visit (INDEPENDENT_AMBULATORY_CARE_PROVIDER_SITE_OTHER): Payer: BC Managed Care – PPO | Admitting: Certified Nurse Midwife

## 2018-12-20 ENCOUNTER — Other Ambulatory Visit: Payer: Self-pay

## 2018-12-20 VITALS — BP 92/61 | HR 80 | Wt 163.3 lb

## 2018-12-20 DIAGNOSIS — O26892 Other specified pregnancy related conditions, second trimester: Secondary | ICD-10-CM

## 2018-12-20 DIAGNOSIS — O09292 Supervision of pregnancy with other poor reproductive or obstetric history, second trimester: Secondary | ICD-10-CM

## 2018-12-20 DIAGNOSIS — Z3492 Encounter for supervision of normal pregnancy, unspecified, second trimester: Secondary | ICD-10-CM

## 2018-12-20 DIAGNOSIS — Z8632 Personal history of gestational diabetes: Secondary | ICD-10-CM

## 2018-12-20 DIAGNOSIS — O09299 Supervision of pregnancy with other poor reproductive or obstetric history, unspecified trimester: Secondary | ICD-10-CM

## 2018-12-20 DIAGNOSIS — Z3A18 18 weeks gestation of pregnancy: Secondary | ICD-10-CM

## 2018-12-20 DIAGNOSIS — Z3689 Encounter for other specified antenatal screening: Secondary | ICD-10-CM

## 2018-12-20 DIAGNOSIS — Z6791 Unspecified blood type, Rh negative: Secondary | ICD-10-CM

## 2018-12-20 DIAGNOSIS — O26899 Other specified pregnancy related conditions, unspecified trimester: Secondary | ICD-10-CM

## 2018-12-20 LAB — POCT URINALYSIS DIPSTICK OB
Bilirubin, UA: NEGATIVE
Glucose, UA: NEGATIVE
Ketones, UA: NEGATIVE
Leukocytes, UA: NEGATIVE
Nitrite, UA: NEGATIVE
POC,PROTEIN,UA: NEGATIVE
Spec Grav, UA: 1.01 (ref 1.010–1.025)
Urobilinogen, UA: 0.2 E.U./dL
pH, UA: 6 (ref 5.0–8.0)

## 2018-12-20 NOTE — Progress Notes (Signed)
ROB-No complaints. Declines flu shot today "maybe later".

## 2018-12-20 NOTE — Progress Notes (Signed)
ROB-Doing well, questions about Rhogam and Glucola testing. History of GDM, early glucose next visit. Anticipatory guidance regarding course of prenatal care. Reviewed red flag symptoms and when to call. RTC x 2-3 weeks for early glucose testing and ANATOMY SCAN or sooner if needed.

## 2018-12-20 NOTE — Patient Instructions (Signed)
WHAT OB PATIENTS CAN EXPECT   Confirmation of pregnancy and ultrasound ordered if medically indicated-[redacted] weeks gestation  New OB (NOB) intake with nurse and New OB (NOB) labs- [redacted] weeks gestation  New OB (NOB) physical examination with provider- 11/[redacted] weeks gestation  Flu vaccine-[redacted] weeks gestation  Anatomy scan-[redacted] weeks gestation  Glucose tolerance test, blood work to test for anemia, T-dap vaccine-[redacted] weeks gestation  Vaginal swabs/cultures-STD/Group B strep-[redacted] weeks gestation  Appointments every 4 weeks until 28 weeks  Every 2 weeks from 28 weeks until 36 weeks  Weekly visits from 36 weeks until delivery    Second Trimester of Pregnancy  The second trimester is from week 14 through week 27 (month 4 through 6). This is often the time in pregnancy that you feel your best. Often times, morning sickness has lessened or quit. You may have more energy, and you may get hungry more often. Your unborn baby is growing rapidly. At the end of the sixth month, he or she is about 9 inches long and weighs about 1 pounds. You will likely feel the baby move between 18 and 20 weeks of pregnancy. Follow these instructions at home: Medicines  Take over-the-counter and prescription medicines only as told by your doctor. Some medicines are safe and some medicines are not safe during pregnancy.  Take a prenatal vitamin that contains at least 600 micrograms (mcg) of folic acid.  If you have trouble pooping (constipation), take medicine that will make your stool soft (stool softener) if your doctor approves. Eating and drinking   Eat regular, healthy meals.  Avoid raw meat and uncooked cheese.  If you get low calcium from the food you eat, talk to your doctor about taking a daily calcium supplement.  Avoid foods that are high in fat and sugars, such as fried and sweet foods.  If you feel sick to your stomach (nauseous) or throw up (vomit): ? Eat 4 or 5 small meals a day instead of 3 large  meals. ? Try eating a few soda crackers. ? Drink liquids between meals instead of during meals.  To prevent constipation: ? Eat foods that are high in fiber, like fresh fruits and vegetables, whole grains, and beans. ? Drink enough fluids to keep your pee (urine) clear or pale yellow. Activity  Exercise only as told by your doctor. Stop exercising if you start to have cramps.  Do not exercise if it is too hot, too humid, or if you are in a place of great height (high altitude).  Avoid heavy lifting.  Wear low-heeled shoes. Sit and stand up straight.  You can continue to have sex unless your doctor tells you not to. Relieving pain and discomfort  Wear a good support bra if your breasts are tender.  Take warm water baths (sitz baths) to soothe pain or discomfort caused by hemorrhoids. Use hemorrhoid cream if your doctor approves.  Rest with your legs raised if you have leg cramps or low back pain.  If you develop puffy, bulging veins (varicose veins) in your legs: ? Wear support hose or compression stockings as told by your doctor. ? Raise (elevate) your feet for 15 minutes, 3-4 times a day. ? Limit salt in your food. Prenatal care  Write down your questions. Take them to your prenatal visits.  Keep all your prenatal visits as told by your doctor. This is important. Safety  Wear your seat belt when driving.  Make a list of emergency phone numbers, including numbers for family,  friends, the hospital, and police and fire departments. General instructions  Ask your doctor about the right foods to eat or for help finding a counselor, if you need these services.  Ask your doctor about local prenatal classes. Begin classes before month 6 of your pregnancy.  Do not use hot tubs, steam rooms, or saunas.  Do not douche or use tampons or scented sanitary pads.  Do not cross your legs for long periods of time.  Visit your dentist if you have not done so. Use a soft toothbrush  to brush your teeth. Floss gently.  Avoid all smoking, herbs, and alcohol. Avoid drugs that are not approved by your doctor.  Do not use any products that contain nicotine or tobacco, such as cigarettes and e-cigarettes. If you need help quitting, ask your doctor.  Avoid cat litter boxes and soil used by cats. These carry germs that can cause birth defects in the baby and can cause a loss of your baby (miscarriage) or stillbirth. Contact a doctor if:  You have mild cramps or pressure in your lower belly.  You have pain when you pee (urinate).  You have bad smelling fluid coming from your vagina.  You continue to feel sick to your stomach (nauseous), throw up (vomit), or have watery poop (diarrhea).  You have a nagging pain in your belly area.  You feel dizzy. Get help right away if:  You have a fever.  You are leaking fluid from your vagina.  You have spotting or bleeding from your vagina.  You have severe belly cramping or pain.  You lose or gain weight rapidly.  You have trouble catching your breath and have chest pain.  You notice sudden or extreme puffiness (swelling) of your face, hands, ankles, feet, or legs.  You have not felt the baby move in over an hour.  You have severe headaches that do not go away when you take medicine.  You have trouble seeing. Summary  The second trimester is from week 14 through week 27 (months 4 through 6). This is often the time in pregnancy that you feel your best.  To take care of yourself and your unborn baby, you will need to eat healthy meals, take medicines only if your doctor tells you to do so, and do activities that are safe for you and your baby.  Call your doctor if you get sick or if you notice anything unusual about your pregnancy. Also, call your doctor if you need help with the right food to eat, or if you want to know what activities are safe for you. This information is not intended to replace advice given to you by  your health care provider. Make sure you discuss any questions you have with your health care provider. Document Released: 06/11/2009 Document Revised: 07/09/2018 Document Reviewed: 04/22/2016 Elsevier Patient Education  2020 [employer].

## 2018-12-29 ENCOUNTER — Encounter: Payer: Self-pay | Admitting: Certified Nurse Midwife

## 2018-12-29 ENCOUNTER — Encounter: Payer: Self-pay | Admitting: Adult Health

## 2019-01-11 ENCOUNTER — Ambulatory Visit (INDEPENDENT_AMBULATORY_CARE_PROVIDER_SITE_OTHER): Payer: BC Managed Care – PPO

## 2019-01-11 ENCOUNTER — Other Ambulatory Visit: Payer: Self-pay

## 2019-01-11 ENCOUNTER — Ambulatory Visit (INDEPENDENT_AMBULATORY_CARE_PROVIDER_SITE_OTHER): Payer: BC Managed Care – PPO | Admitting: Certified Nurse Midwife

## 2019-01-11 ENCOUNTER — Other Ambulatory Visit: Payer: BC Managed Care – PPO

## 2019-01-11 VITALS — BP 102/65 | HR 83 | Wt 169.1 lb

## 2019-01-11 DIAGNOSIS — Z363 Encounter for antenatal screening for malformations: Secondary | ICD-10-CM

## 2019-01-11 DIAGNOSIS — R7309 Other abnormal glucose: Secondary | ICD-10-CM

## 2019-01-11 DIAGNOSIS — Z3492 Encounter for supervision of normal pregnancy, unspecified, second trimester: Secondary | ICD-10-CM

## 2019-01-11 DIAGNOSIS — Z3689 Encounter for other specified antenatal screening: Secondary | ICD-10-CM

## 2019-01-11 DIAGNOSIS — Z8632 Personal history of gestational diabetes: Secondary | ICD-10-CM

## 2019-01-11 LAB — POCT URINALYSIS DIPSTICK OB
Bilirubin, UA: NEGATIVE
Blood, UA: NEGATIVE
Glucose, UA: NEGATIVE
Ketones, UA: NEGATIVE
Leukocytes, UA: NEGATIVE
Nitrite, UA: NEGATIVE
POC,PROTEIN,UA: NEGATIVE
Spec Grav, UA: 1.01 (ref 1.010–1.025)
Urobilinogen, UA: 0.2 E.U./dL
pH, UA: 5 (ref 5.0–8.0)

## 2019-01-11 NOTE — Progress Notes (Signed)
ROB doing well. Early 1 hr glucose today for hx of GDM. Anatomy scan today ( see below) . Reviewed with pt. Round ligament pain reviewed. Encouraged use of belly band, tylenol, heat and ice as needed for pain. Follow up 3 wks for ROB.   Patient Name: Jasmine Edwards DOB: 1990/12/13 MRN: [id number] ULTRASOUND REPORT  Location: [facility] Date of Service: 01/11/2019   Indications:Anatomy Ultrasound Findings:  Nelda [place]se[place] intrauterine pregnancy is visualized with FHR at 149 BPM. Biometrics give an (U/S) Gestational age of [redacted]w[redacted]d and an (U/S) EDD of 05/21/19; this correlates with the clinically established Estimated Date of Delivery: 05/23/19  Fetal presentation is Cephalic.  EFW: 362 g ( 13 oz). Placenta: anterior. Grade: 1 AFI: subjectively normal.  Anatomic survey is complete and normal; Gender - female.    Right Ovary is normal in appearance. Left Ovary is normal appearance. Survey of the adnexa demonstrates no adnexal masses. There is no free peritoneal fluid in the cul de sac.  Impression: 1. [redacted]w[redacted]d Viable Singleton Intrauterine pregnancy by U/S. 2. (U/S) EDD is consistent with Clinically established Estimated Date of Delivery: 05/23/19 . 3. Normal Anatomy Scan  Recommendations: 1.Clinical correlation with the patient's History and Physical Exam.   Jenine M. Albertine Grates    RDMS

## 2019-01-11 NOTE — Patient Instructions (Signed)

## 2019-01-12 LAB — GLUCOSE, 1 HOUR GESTATIONAL: Gestational Diabetes Screen: 141 mg/dL — ABNORMAL HIGH (ref 65–139)

## 2019-02-03 ENCOUNTER — Other Ambulatory Visit: Payer: Self-pay

## 2019-02-03 ENCOUNTER — Ambulatory Visit (INDEPENDENT_AMBULATORY_CARE_PROVIDER_SITE_OTHER): Payer: Medicaid Other | Admitting: Obstetrics and Gynecology

## 2019-02-03 VITALS — BP 108/67 | HR 84 | Wt 177.0 lb

## 2019-02-03 DIAGNOSIS — Z3492 Encounter for supervision of normal pregnancy, unspecified, second trimester: Secondary | ICD-10-CM | POA: Diagnosis not present

## 2019-02-03 DIAGNOSIS — Z3A24 24 weeks gestation of pregnancy: Secondary | ICD-10-CM | POA: Diagnosis not present

## 2019-02-03 LAB — POCT URINALYSIS DIPSTICK OB
Bilirubin, UA: NEGATIVE
Blood, UA: NEGATIVE
Glucose, UA: NEGATIVE
Ketones, UA: NEGATIVE
Leukocytes, UA: NEGATIVE
Nitrite, UA: NEGATIVE
POC,PROTEIN,UA: NEGATIVE
Spec Grav, UA: 1.015 (ref 1.010–1.025)
Urobilinogen, UA: 0.2 E.U./dL
pH, UA: 5 (ref 5.0–8.0)

## 2019-02-03 NOTE — Progress Notes (Signed)
Patient is here for her [place] visit. Refused flu shot

## 2019-02-03 NOTE — Progress Notes (Signed)
ROB- doing well, glucola next visit.desires BTL after this delivery PPTL medicaid papers signed.

## 2019-02-03 NOTE — Patient Instructions (Signed)
Postpartum Tubal Ligation Postpartum tubal ligation (PPTL) is a procedure to close the fallopian tubes. This is done so that you cannot get pregnant. When the fallopian tubes are closed, the eggs that the ovaries release cannot enter the uterus, and sperm cannot reach the eggs. PPTL is done right after childbirth or 1-2 days after childbirth, before the uterus returns to its normal location. If you have a cesarean section, it can be performed at the same time as the procedure. Having this done after childbirth does not make your stay in the hospital longer. PPTL is sometimes called "getting your tubes tied." You should not have this procedure if you want to get pregnant again or if you are unsure about having more children. Tell a health care provider about:  Any allergies you have.  All medicines you are taking, including vitamins, herbs, eye drops, creams, and over-the-counter medicines.  Any problems you or family members have had with anesthetic medicines.  Any blood disorders you have.  Any surgeries you have had.  Any medical conditions you have or have had.  Any past pregnancies. What are the risks? Generally, this is a safe procedure. However, problems may occur, including:  Infection.  Bleeding.  Injury to other organs in the abdomen.  Side effects from anesthetic medicines.  Failure of the procedure. If this happens, you could get pregnant.  Having a fertilized egg attach outside the uterus (ectopic pregnancy). What happens before the procedure?  Ask your health care provider about: ? How much pain you can expect to have. ? What medicines you will be given for pain, especially if you are planning to breastfeed. What happens during the procedure? If you had a vaginal delivery:  You will be given one or more of the following: ? A medicine to help you relax (sedative). ? A medicine to numb the area (local anesthetic). ? A medicine to make you fall asleep (general  anesthetic). ? A medicine that is injected into an area of your body to numb everything below the injection site (regional anesthetic).  If you have been given a general anesthetic, a tube will be put down your throat to help you breathe.  An IV will be inserted into one of your veins.  Your bladder may be emptied with a small tube (catheter).  An incision will be made just below your belly button.  Your fallopian tubes will be located and brought up through the incision.  Your fallopian tubes will be tied off, burned (cauterized), or blocked with a clip, ring, or clamp. A small part in the center of each fallopian tube may be removed.  The incision will be closed with stitches (sutures).  A bandage (dressing) will be placed over the incision. If you had a cesarean delivery:  Tubal ligation will be done through the incision that was used for the cesarean delivery of your baby.  The incision will be closed with sutures.  A dressing will be placed over the incision. The procedure may vary among health care providers and hospitals. What happens after the procedure?  Your blood pressure, heart rate, breathing rate, and blood oxygen level will be monitored until you leave the hospital.  You will be given pain medicine as needed.  Do not drive for 24 hours if you were given a sedative during your procedure. Summary  Postpartum tubal ligation is a procedure that closes the fallopian tubes so you cannot get pregnant anymore.  This procedure is done while you are still   in the hospital after childbirth. If you have a cesarean section, it can be performed at the same time.  Having this done after childbirth does not make your stay in the hospital longer.  Postpartum tubal ligation is considered permanent. You should not have this procedure if you want to get pregnant again or if you are unsure about having more children.  Talk to your health care provider to see if this procedure is  right for you. This information is not intended to replace advice given to you by your health care provider. Make sure you discuss any questions you have with your health care provider. Document Released: 03/17/2005 Document Revised: 02/04/2018 Document Reviewed: 02/04/2018 Elsevier Patient Education  2020 Elsevier Inc.  

## 2019-02-23 ENCOUNTER — Telehealth: Payer: Self-pay

## 2019-02-23 ENCOUNTER — Telehealth: Payer: Self-pay | Admitting: Certified Nurse Midwife

## 2019-02-23 NOTE — Telephone Encounter (Signed)
Mychart message sent to patient regarding Braxton Hicks contractions.

## 2019-02-23 NOTE — Telephone Encounter (Signed)
Pt called in said that she was having so contractions, Pt stated she took a hot bath and they are easing off. PT is requesting a call back from the nurse. Please Advise.

## 2019-03-04 ENCOUNTER — Other Ambulatory Visit: Payer: Self-pay

## 2019-03-04 ENCOUNTER — Other Ambulatory Visit: Payer: BC Managed Care – PPO

## 2019-03-04 ENCOUNTER — Ambulatory Visit (INDEPENDENT_AMBULATORY_CARE_PROVIDER_SITE_OTHER): Payer: BC Managed Care – PPO | Admitting: Certified Nurse Midwife

## 2019-03-04 VITALS — BP 113/70 | HR 97 | Wt 179.6 lb

## 2019-03-04 DIAGNOSIS — Z113 Encounter for screening for infections with a predominantly sexual mode of transmission: Secondary | ICD-10-CM

## 2019-03-04 DIAGNOSIS — Z131 Encounter for screening for diabetes mellitus: Secondary | ICD-10-CM

## 2019-03-04 DIAGNOSIS — Z23 Encounter for immunization: Secondary | ICD-10-CM | POA: Diagnosis not present

## 2019-03-04 DIAGNOSIS — Z3A29 29 weeks gestation of pregnancy: Secondary | ICD-10-CM | POA: Diagnosis not present

## 2019-03-04 DIAGNOSIS — Z130 Encounter for screening for diseases of the blood and blood-forming organs and certain disorders involving the immune mechanism: Secondary | ICD-10-CM

## 2019-03-04 DIAGNOSIS — Z6791 Unspecified blood type, Rh negative: Secondary | ICD-10-CM | POA: Diagnosis not present

## 2019-03-04 DIAGNOSIS — O26893 Other specified pregnancy related conditions, third trimester: Secondary | ICD-10-CM

## 2019-03-04 DIAGNOSIS — Z3492 Encounter for supervision of normal pregnancy, unspecified, second trimester: Secondary | ICD-10-CM

## 2019-03-04 LAB — POCT URINALYSIS DIPSTICK OB
Bilirubin, UA: NEGATIVE
Blood, UA: NEGATIVE
Glucose, UA: NEGATIVE
Ketones, UA: NEGATIVE
Leukocytes, UA: NEGATIVE
Nitrite, UA: NEGATIVE
POC,PROTEIN,UA: NEGATIVE
Spec Grav, UA: 1.01 (ref 1.010–1.025)
Urobilinogen, UA: 0.2 E.U./dL
pH, UA: 5 (ref 5.0–8.0)

## 2019-03-04 MED ORDER — TETANUS-DIPHTH-ACELL PERTUSSIS 5-2.5-18.5 LF-MCG/0.5 IM SUSP
0.5000 mL | Freq: Once | INTRAMUSCULAR | Status: AC
  Administered 2019-03-04: 0.5 mL via INTRAMUSCULAR

## 2019-03-04 MED ORDER — RHO D IMMUNE GLOBULIN 1500 UNITS IM SOSY
1500.0000 [IU] | PREFILLED_SYRINGE | Freq: Once | INTRAMUSCULAR | Status: AC
  Administered 2019-03-04: 1500 [IU] via INTRAMUSCULAR

## 2019-03-04 NOTE — Patient Instructions (Signed)
Glucose Tolerance Test During Pregnancy Why am I having this test? The glucose tolerance test (GTT) is done to check how your body processes sugar (glucose). This is one of several tests used to diagnose diabetes that develops during pregnancy (gestational diabetes mellitus). Gestational diabetes is a temporary form of diabetes that some women develop during pregnancy. It usually occurs during the second trimester of pregnancy and goes away after delivery. Testing (screening) for gestational diabetes usually occurs between 24 and 28 weeks of pregnancy. You may have the GTT test after having a 1-hour glucose screening test if the results from that test indicate that you may have gestational diabetes. You may also have this test if:  You have a history of gestational diabetes.  You have a history of giving birth to very large babies or have experienced repeated fetal loss (stillbirth).  You have signs and symptoms of diabetes, such as: ? Changes in your vision. ? Tingling or numbness in your hands or feet. ? Changes in hunger, thirst, and urination that are not otherwise explained by your pregnancy. What is being tested? This test measures the amount of glucose in your blood at different times during a period of 3 hours. This indicates how well your body is able to process glucose. What kind of sample is taken?  Blood samples are required for this test. They are usually collected by inserting a needle into a blood vessel. How do I prepare for this test?  For 3 days before your test, eat normally. Have plenty of carbohydrate-rich foods.  Follow instructions from your health care provider about: ? Eating or drinking restrictions on the day of the test. You may be asked to not eat or drink anything other than water (fast) starting 8-10 hours before the test. ? Changing or stopping your regular medicines. Some medicines may interfere with this test. Tell a health care provider about:  All  medicines you are taking, including vitamins, herbs, eye drops, creams, and over-the-counter medicines.  Any blood disorders you have.  Any surgeries you have had.  Any medical conditions you have. What happens during the test? First, your blood glucose will be measured. This is referred to as your fasting blood glucose, since you fasted before the test. Then, you will drink a glucose solution that contains a certain amount of glucose. Your blood glucose will be measured again 1, 2, and 3 hours after drinking the solution. This test takes about 3 hours to complete. You will need to stay at the testing location during this time. During the testing period:  Do not eat or drink anything other than the glucose solution.  Do not exercise.  Do not use any products that contain nicotine or tobacco, such as cigarettes and e-cigarettes. If you need help stopping, ask your health care provider. The testing procedure may vary among health care providers and hospitals. How are the results reported? Your results will be reported as milligrams of glucose per deciliter of blood (mg/dL) or millimoles per liter (mmol/L). Your health care provider will compare your results to normal ranges that were established after testing a large group of people (reference ranges). Reference ranges may vary among labs and hospitals. For this test, common reference ranges are:  Fasting: less than 95-105 mg/dL (5.3-5.8 mmol/L).  1 hour after drinking glucose: less than 180-190 mg/dL (10.0-10.5 mmol/L).  2 hours after drinking glucose: less than 155-165 mg/dL (8.6-9.2 mmol/L).  3 hours after drinking glucose: 140-145 mg/dL (7.8-8.1 mmol/L). What do the   results mean? Results within reference ranges are considered normal, meaning that your glucose levels are well-controlled. If two or more of your blood glucose levels are high, you may be diagnosed with gestational diabetes. If only one level is high, your health care  provider may suggest repeat testing or other tests to confirm a diagnosis. Talk with your health care provider about what your results mean. Questions to ask your health care provider Ask your health care provider, or the department that is doing the test:  When will my results be ready?  How will I get my results?  What are my treatment options?  What other tests do I need?  What are my next steps? Summary  The glucose tolerance test (GTT) is one of several tests used to diagnose diabetes that develops during pregnancy (gestational diabetes mellitus). Gestational diabetes is a temporary form of diabetes that some women develop during pregnancy.  You may have the GTT test after having a 1-hour glucose screening test if the results from that test indicate that you may have gestational diabetes. You may also have this test if you have any symptoms or risk factors for gestational diabetes.  Talk with your health care provider about what your results mean. This information is not intended to replace advice given to you by your health care provider. Make sure you discuss any questions you have with your health care provider. Document Released: 09/16/2011 Document Revised: 07/08/2018 Document Reviewed: 10/27/2016 Elsevier Patient Education  2020 Elsevier Inc.  

## 2019-03-04 NOTE — Progress Notes (Signed)
ROB doing well. Feels good movement. Pt state she had epidosde of uncomfortable braxton hicks over thanksgiving. She was almost to  The point of coming into hospital. She state she drank water, and took a bath and it eventually calmed down. Discussed S&S of PTL. Discussed FFN if she has episode again. She verbalizes understanding. 28 wks labs today 3 hr glucose/TDAP/RPR/CBC/BTC. Sample birth plan given. Dis cussed birth control. Tubal paper signed visit. PT state her husband may get vasectomy and if he does than she will not need Tubal. She discussed her concerns for delivery and is requesting that I deliver her. She state she had difficult experience with exam last pregnancy. Discussed guest midwives and MD coverage while Sharyn Lull is out. Discussed possibility of me specialing her once we get closer to her due date. She verbalizes understanding. Follow up 2 wks.   Philip Aspen, CNM

## 2019-03-05 LAB — CBC
Hematocrit: 38.9 % (ref 34.0–46.6)
Hemoglobin: 13.1 g/dL (ref 11.1–15.9)
MCH: 31.4 pg (ref 26.6–33.0)
MCHC: 33.7 g/dL (ref 31.5–35.7)
MCV: 93 fL (ref 79–97)
Platelets: 200 10*3/uL (ref 150–450)
RBC: 4.17 x10E6/uL (ref 3.77–5.28)
RDW: 11.5 % — ABNORMAL LOW (ref 11.7–15.4)
WBC: 6.1 10*3/uL (ref 3.4–10.8)

## 2019-03-05 LAB — GESTATIONAL GLUCOSE TOLERANCE
Glucose, Fasting: 73 mg/dL (ref 65–94)
Glucose, GTT - 1 Hour: 163 mg/dL (ref 65–179)
Glucose, GTT - 2 Hour: 120 mg/dL (ref 65–154)
Glucose, GTT - 3 Hour: 47 mg/dL — ABNORMAL LOW (ref 65–139)

## 2019-03-05 LAB — RPR: RPR Ser Ql: NONREACTIVE

## 2019-03-21 NOTE — Telephone Encounter (Signed)
Pt called in cord around baby neck. Called back to ss she said ask if baby is moving. Pt stated yes but not like normal told pt to drink some cold water and lay on left side. Advise pt she will get a call later today

## 2019-03-23 ENCOUNTER — Other Ambulatory Visit: Payer: Self-pay

## 2019-03-23 ENCOUNTER — Ambulatory Visit (INDEPENDENT_AMBULATORY_CARE_PROVIDER_SITE_OTHER): Payer: BC Managed Care – PPO | Admitting: Certified Nurse Midwife

## 2019-03-23 VITALS — BP 117/62 | HR 80 | Wt 185.4 lb

## 2019-03-23 DIAGNOSIS — Z3A32 32 weeks gestation of pregnancy: Secondary | ICD-10-CM

## 2019-03-23 DIAGNOSIS — Z3493 Encounter for supervision of normal pregnancy, unspecified, third trimester: Secondary | ICD-10-CM

## 2019-03-23 DIAGNOSIS — Z3492 Encounter for supervision of normal pregnancy, unspecified, second trimester: Secondary | ICD-10-CM

## 2019-03-23 LAB — POCT URINALYSIS DIPSTICK OB
Bilirubin, UA: NEGATIVE
Blood, UA: NEGATIVE
Glucose, UA: NEGATIVE
Ketones, UA: NEGATIVE
Leukocytes, UA: NEGATIVE
Nitrite, UA: NEGATIVE
POC,PROTEIN,UA: NEGATIVE
Spec Grav, UA: 1.01 (ref 1.010–1.025)
Urobilinogen, UA: 0.2 E.U./dL
pH, UA: 5 (ref 5.0–8.0)

## 2019-03-23 NOTE — Patient Instructions (Signed)
How a Baby Grows During Pregnancy  Pregnancy begins when a female's sperm enters a female's egg (fertilization). Fertilization usually happens in one of the tubes (fallopian tubes) that connect the ovaries to the womb (uterus). The fertilized egg moves down the fallopian tube to the uterus. Once it reaches the uterus, it implants into the lining of the uterus and begins to grow. For the first 10 weeks, the fertilized egg is called an embryo. After 10 weeks, it is called a fetus. As the fetus continues to grow, it receives oxygen and nutrients through tissue (placenta) that grows to support the developing baby. The placenta is the life support system for the baby. It provides oxygen and nutrition and removes waste. Learning as much as you can about your pregnancy and how your baby is developing can help you enjoy the experience. It can also make you aware of when there might be a problem and when to ask questions. How long does a typical pregnancy last? A pregnancy usually lasts 280 days, or about 40 weeks. Pregnancy is divided into three periods of growth, also called trimesters:  First trimester: 0-12 weeks.  Second trimester: 13-27 weeks.  Third trimester: 28-40 weeks. The day when your baby is ready to be born (full term) is your estimated date of delivery. How does my baby develop month by month? First month  The fertilized egg attaches to the inside of the uterus.  Some cells will form the placenta. Others will form the fetus.  The arms, legs, brain, spinal cord, lungs, and heart begin to develop.  At the end of the first month, the heart begins to beat. Second month  The bones, inner ear, eyelids, hands, and feet form.  The genitals develop.  By the end of 8 weeks, all major organs are developing. Third month  All of the internal organs are forming.  Teeth develop below the gums.  Bones and muscles begin to grow. The spine can flex.  The skin is transparent.  Fingernails  and toenails begin to form.  Arms and legs continue to grow longer, and hands and feet develop.  The fetus is about 3 inches (7.6 cm) long. Fourth month  The placenta is completely formed.  The external sex organs, neck, outer ear, eyebrows, eyelids, and fingernails are formed.  The fetus can hear, swallow, and move its arms and legs.  The kidneys begin to produce urine.  The skin is covered with a white, waxy coating (vernix) and very fine hair (lanugo). Fifth month  The fetus moves around more and can be felt for the first time (quickening).  The fetus starts to sleep and wake up and may begin to suck its finger.  The nails grow to the end of the fingers.  The organ in the digestive system that makes bile (gallbladder) functions and helps to digest nutrients.  If your baby is a girl, eggs are present in her ovaries. If your baby is a boy, testicles start to move down into his scrotum. Sixth month  The lungs are formed.  The eyes open. The brain continues to develop.  Your baby has fingerprints and toe prints. Your baby's hair grows thicker.  At the end of the second trimester, the fetus is about 9 inches (22.9 cm) long. Seventh month  The fetus kicks and stretches.  The eyes are developed enough to sense changes in light.  The hands can make a grasping motion.  The fetus responds to sound. Eighth month  All   organs and body systems are fully developed and functioning.  Bones harden, and taste buds develop. The fetus may hiccup.  Certain areas of the brain are still developing. The skull remains soft. Ninth month  The fetus gains about  lb (0.23 kg) each week.  The lungs are fully developed.  Patterns of sleep develop.  The fetus's head typically moves into a head-down position (vertex) in the uterus to prepare for birth.  The fetus weighs 6-9 lb (2.72-4.08 kg) and is 19-20 inches (48.26-50.8 cm) long. What can I do to have a healthy pregnancy and help  my baby develop? General instructions  Take prenatal vitamins as directed by your health care provider. These include vitamins such as folic acid, iron, calcium, and vitamin D. They are important for healthy development.  Take medicines only as directed by your health care provider. Read labels and ask a pharmacist or your health care provider whether over-the-counter medicines, supplements, and prescription drugs are safe to take during pregnancy.  Keep all follow-up visits as directed by your health care provider. This is important. Follow-up visits include prenatal care and screening tests. How do I know if my baby is developing well? At each prenatal visit, your health care provider will do several different tests to check on your health and keep track of your baby's development. These include:  Fundal height and position. ? Your health care provider will measure your growing belly from your pubic bone to the top of the uterus using a tape measure. ? Your health care provider will also feel your belly to determine your baby's position.  Heartbeat. ? An ultrasound in the first trimester can confirm pregnancy and show a heartbeat, depending on how far along you are. ? Your health care provider will check your baby's heart rate at every prenatal visit.  Second trimester ultrasound. ? This ultrasound checks your baby's development. It also may show your baby's gender. What should I do if I have concerns about my baby's development? Always talk with your health care provider about any concerns that you may have about your pregnancy and your baby. Summary  A pregnancy usually lasts 280 days, or about 40 weeks. Pregnancy is divided into three periods of growth, also called trimesters.  Your health care provider will monitor your baby's growth and development throughout your pregnancy.  Follow your health care provider's recommendations about taking prenatal vitamins and medicines during  your pregnancy.  Talk with your health care provider if you have any concerns about your pregnancy or your developing baby. This information is not intended to replace advice given to you by your health care provider. Make sure you discuss any questions you have with your health care provider. Document Released: 09/03/2007 Document Revised: 07/08/2018 Document Reviewed: 01/28/2017 Elsevier Patient Education  2020 Elsevier Inc.  

## 2019-03-23 NOTE — Progress Notes (Signed)
ROB doing well. Discussed the 3D ultrasound that she had done for pictures ( nuchal cord). Discussed that it is not for medical use . Discussed fetal movement in relationship to fetal well bing also discussed amniotic fluid and staying hydrated to help keep fluid level WNL. She complains today of spots in eyes and epigastric pain. BP WNL, no swelling refelxes WNL. Discussed pre -e signs and symptoms. Will follow up with results. Return in 2 wks or sooner.   Philip Aspen, CNM

## 2019-03-24 LAB — COMPREHENSIVE METABOLIC PANEL
ALT: 8 IU/L (ref 0–32)
AST: 14 IU/L (ref 0–40)
Albumin/Globulin Ratio: 1.5 (ref 1.2–2.2)
Albumin: 3.5 g/dL — ABNORMAL LOW (ref 3.9–5.0)
Alkaline Phosphatase: 55 IU/L (ref 39–117)
BUN/Creatinine Ratio: 16 (ref 9–23)
BUN: 9 mg/dL (ref 6–20)
Bilirubin Total: 0.2 mg/dL (ref 0.0–1.2)
CO2: 20 mmol/L (ref 20–29)
Calcium: 8.9 mg/dL (ref 8.7–10.2)
Chloride: 105 mmol/L (ref 96–106)
Creatinine, Ser: 0.57 mg/dL (ref 0.57–1.00)
GFR calc Af Amer: 148 mL/min/{1.73_m2} (ref >59)
GFR calc non Af Amer: 128 mL/min/{1.73_m2} (ref >59)
Globulin, Total: 2.4 g/dL (ref 1.5–4.5)
Glucose: 76 mg/dL (ref 65–99)
Potassium: 4 mmol/L (ref 3.5–5.2)
Sodium: 136 mmol/L (ref 134–144)
Total Protein: 5.9 g/dL — ABNORMAL LOW (ref 6.0–8.5)

## 2019-03-24 LAB — PROTEIN / CREATININE RATIO, URINE
Creatinine, Urine: 6.6 mg/dL
Protein, Ur: <4 mg/dL

## 2019-03-24 LAB — CBC
Hematocrit: 37.6 % (ref 34.0–46.6)
Hemoglobin: 12.5 g/dL (ref 11.1–15.9)
MCH: 31.2 pg (ref 26.6–33.0)
MCHC: 33.2 g/dL (ref 31.5–35.7)
MCV: 94 fL (ref 79–97)
Platelets: 230 10*3/uL (ref 150–450)
RBC: 4.01 x10E6/uL (ref 3.77–5.28)
RDW: 11.4 % — ABNORMAL LOW (ref 11.7–15.4)
WBC: 8.1 10*3/uL (ref 3.4–10.8)

## 2019-04-01 NOTE — L&D Delivery Note (Signed)
       Delivery Note   Jasmine Edwards is a 29 y.o. [id number] at [redacted]w[redacted]d Estimated Date of Delivery: 05/23/19  PRE-OPERATIVE DIAGNOSIS:  1) [redacted]w[redacted]d pregnancy.   POST-OPERATIVE DIAGNOSIS:  1) [redacted]w[redacted]d pregnancy s/p Vaginal, Spontaneous   Delivery Type: Vaginal, Spontaneous    Delivery Anesthesia: Epidural   Labor Complications:  none    ESTIMATED BLOOD LOSS: 150  ml    FINDINGS:   1) female infant, Apgar scores of 8   at 1 minute and 9   at 5 minutes and a birthweight pending, infant remain skin to skin.    2) Nuchal cord: No SPECIMENS:   PLACENTA:   Appearance: Intact , 3 vessel cord, cord blood sample colleced   Removal: Spontaneous      Disposition:  held per protocol then discarded   DISPOSITION:  Infant to left in stable condition in the delivery room, with L&D personnel and mother,  NARRATIVE SUMMARY: Labor course:  [name] is a [id number] at [redacted]w[redacted]d who presented for labor management.  She progressed well in labor without pitocin.  She received the appropriate anesthesia and proceeded to complete dilation. She evidenced good maternal expulsive effort during the second stage. She went on to deliver a viable female infant " Ensleigh ". The placenta delivered without problems and was noted to be complete. A perineal and vaginal examination was performed. Episiotomy/Lacerations:  none, small perineal abrasions, hemostatic not in need of repair. The patient tolerated this well.  Doreene Burke, CNM  05/12/2019 2:13 AM

## 2019-04-06 ENCOUNTER — Ambulatory Visit (INDEPENDENT_AMBULATORY_CARE_PROVIDER_SITE_OTHER): Payer: Medicaid Other | Admitting: Certified Nurse Midwife

## 2019-04-06 ENCOUNTER — Other Ambulatory Visit: Payer: Self-pay

## 2019-04-06 ENCOUNTER — Encounter: Payer: Self-pay | Admitting: Certified Nurse Midwife

## 2019-04-06 VITALS — BP 101/78 | HR 98 | Wt 186.7 lb

## 2019-04-06 DIAGNOSIS — Z3493 Encounter for supervision of normal pregnancy, unspecified, third trimester: Secondary | ICD-10-CM

## 2019-04-06 DIAGNOSIS — Z3A33 33 weeks gestation of pregnancy: Secondary | ICD-10-CM

## 2019-04-06 LAB — POCT URINALYSIS DIPSTICK OB
Bilirubin, UA: NEGATIVE
Blood, UA: NEGATIVE
Glucose, UA: NEGATIVE
Leukocytes, UA: NEGATIVE
Nitrite, UA: NEGATIVE
POC,PROTEIN,UA: NEGATIVE
Spec Grav, UA: 1.01 (ref 1.010–1.025)
Urobilinogen, UA: 0.2 E.U./dL
pH, UA: 6 (ref 5.0–8.0)

## 2019-04-06 NOTE — Patient Instructions (Signed)
[place] [facility] List  [facility]  [address], [place], [place] [address]  Phone: [phone]  [facility] (second location)  [address] [place], [place] [address]  Phone: [phone]  [facility] ([place]) [address], [place], [place] [address] Phone: [phone]  [facility]  [address] [place], [place] [address]  Phone: [phone] 

## 2019-04-06 NOTE — Progress Notes (Signed)
ROB-No complaints.  

## 2019-04-06 NOTE — Progress Notes (Signed)
ROB doing well. Feels movement. Has lots of aches and pelvic discomfort. Reassurance given. She asks about elective induction at 39 wks, states that things went so well for her last time. Discussed not recommended if pregnancy continues to be normal. Discussed increasing risk of c section and need to get approved from MD's. She verbalizes understanding. Follow up 2 wks.   Doreene Burke, CNM

## 2019-04-08 ENCOUNTER — Telehealth: Payer: Self-pay | Admitting: Certified Nurse Midwife

## 2019-04-08 NOTE — Telephone Encounter (Signed)
Pt called needed medical records sent to [facility] [place] but they can see what he have in epic pt said that she said they cant see something I told pt to please call them and tell them to fax over what they needs from [place] that they cant see

## 2019-04-20 ENCOUNTER — Encounter: Payer: Self-pay | Admitting: Certified Nurse Midwife

## 2019-04-20 ENCOUNTER — Ambulatory Visit (INDEPENDENT_AMBULATORY_CARE_PROVIDER_SITE_OTHER): Payer: Medicaid Other | Admitting: Certified Nurse Midwife

## 2019-04-20 ENCOUNTER — Other Ambulatory Visit: Payer: Self-pay

## 2019-04-20 VITALS — BP 106/73 | HR 98 | Wt 191.7 lb

## 2019-04-20 DIAGNOSIS — Z3493 Encounter for supervision of normal pregnancy, unspecified, third trimester: Secondary | ICD-10-CM

## 2019-04-20 DIAGNOSIS — Z0289 Encounter for other administrative examinations: Secondary | ICD-10-CM

## 2019-04-20 DIAGNOSIS — Z3A35 35 weeks gestation of pregnancy: Secondary | ICD-10-CM

## 2019-04-20 LAB — POCT URINALYSIS DIPSTICK OB
Bilirubin, UA: NEGATIVE
Blood, UA: NEGATIVE
Glucose, UA: NEGATIVE
Ketones, UA: NEGATIVE
Leukocytes, UA: NEGATIVE
Nitrite, UA: NEGATIVE
POC,PROTEIN,UA: NEGATIVE
Spec Grav, UA: 1.01 (ref 1.010–1.025)
Urobilinogen, UA: 0.2 E.U./dL
pH, UA: 6 (ref 5.0–8.0)

## 2019-04-20 NOTE — Patient Instructions (Signed)
Group B Streptococcus Infection During Pregnancy °Group B Streptococcus (GBS) is a type of bacteria that is often found in healthy people. It is commonly found in the rectum, vagina, and intestines. In people who are healthy and not pregnant, the bacteria rarely cause serious illness or complications. However, women who test positive for GBS during pregnancy can pass the bacteria to the baby during childbirth. This can cause serious infection in the baby after birth. °Women with GBS may also have infections during their pregnancy or soon after childbirth. The infections include urinary tract infections (UTIs) or infections of the uterus. GBS also increases a woman's risk of complications during pregnancy, such as early labor or delivery, miscarriage, or stillbirth. Routine testing for GBS is recommended for all pregnant women. °What are the causes? °This condition is caused by bacteria called Streptococcus agalactiae. °What increases the risk? °You may have a higher risk for GBS infection during pregnancy if you had one during a past pregnancy. °What are the signs or symptoms? °In most cases, GBS infection does not cause symptoms in pregnant women. If symptoms exist, they may include: °· Labor that starts before the 37th week of pregnancy. °· A UTI or bladder infection. This may cause a fever, frequent urination, or pain and burning during urination. °· Fever during labor. There can also be a rapid heartbeat in the mother or baby. °Rare but serious symptoms of a GBS infection in women include: °· Blood infection (septicemia). This may cause fever, chills, or confusion. °· Lung infection (pneumonia). This may cause fever, chills, cough, rapid breathing, chest pain, or difficulty breathing. °· Bone, joint, skin, or soft tissue infection. °How is this diagnosed? °You may be screened for GBS between week 35 and week 37 of pregnancy. If you have symptoms of preterm labor, you may be screened earlier. This condition is  diagnosed based on lab test results from: °· A swab of fluid from the vagina and rectum. °· A urine sample. °How is this treated? °This condition is treated with antibiotic medicine. Antibiotic medicine may be given: °· To you when you go into labor, or as soon as your water breaks. The medicines will continue until after you give birth. If you are having a cesarean delivery, you do not need antibiotics unless your water has broken. °· To your baby, if he or she requires treatment. Your health care provider will check your baby to decide if he or she needs antibiotics to prevent a serious infection. °Follow these instructions at home: °· Take over-the-counter and prescription medicines only as told by your health care provider. °· Take your antibiotic medicine as told by your health care provider. Do not stop taking the antibiotic even if you start to feel better. °· Keep all pre-birth (prenatal) visits and follow-up visits as told by your health care provider. This is important. °Contact a health care provider if: °· You have pain or burning when you urinate. °· You have to urinate more often than usual. °· You have a fever or chills. °· You develop a bad-smelling vaginal discharge. °Get help right away if: °· Your water breaks. °· You go into labor. °· You have severe pain in your abdomen. °· You have difficulty breathing. °· You have chest pain. °These symptoms may represent a serious problem that is an emergency. Do not wait to see if the symptoms will go away. Get medical help right away. Call your local emergency services (911 in the [place] Do not drive yourself to   the hospital. °Summary °· GBS is a type of bacteria that is common in healthy people. °· During pregnancy, colonization with GBS can cause serious complications for you or your baby. °· Your health care provider will screen you between 35 and 37 weeks of pregnancy to determine if you are colonized with GBS. °· If you are colonized with GBS during  pregnancy, your health care provider will recommend antibiotics through an IV during labor. °· After delivery, your baby will be evaluated for complications related to potential GBS infection and may require antibiotics to prevent a serious infection. °This information is not intended to replace advice given to you by your health care provider. Make sure you discuss any questions you have with your health care provider. °Document Revised: 10/11/2018 Document Reviewed: 10/11/2018 °Elsevier Patient Education © 2020 Elsevier Inc. ° °

## 2019-04-20 NOTE — Progress Notes (Signed)
ROB-Patient c/o intermittent vaginal and lower back pain and pressure x1 week.

## 2019-04-20 NOTE — Progress Notes (Signed)
ROB doing well. Feels like movement has decreased. Discussed how movement changes as the baby increases in size., pt encouraged to drink water, have a sack and lie down and see if she talks to or pushes her belly if she will move. While listening to heart tones 2 movement felt by me externally, acceleration audible. Reassurance given. Pt instructed if baby does not respond then she should reach out to [place] and we will evalute baby. She verbalizes and agree to plan. Reviewed GBS testing next visit. Follow up 1 wk.   Doreene Burke, CNM

## 2019-04-22 ENCOUNTER — Telehealth: Payer: Self-pay

## 2019-04-22 NOTE — Telephone Encounter (Signed)
mychart message sent- FMLA faxed.

## 2019-04-26 ENCOUNTER — Encounter: Payer: Self-pay | Admitting: Certified Nurse Midwife

## 2019-04-26 ENCOUNTER — Ambulatory Visit (INDEPENDENT_AMBULATORY_CARE_PROVIDER_SITE_OTHER): Payer: Medicaid Other | Admitting: Certified Nurse Midwife

## 2019-04-26 ENCOUNTER — Other Ambulatory Visit: Payer: Self-pay

## 2019-04-26 VITALS — BP 117/74 | HR 90 | Wt 189.6 lb

## 2019-04-26 DIAGNOSIS — Z3493 Encounter for supervision of normal pregnancy, unspecified, third trimester: Secondary | ICD-10-CM

## 2019-04-26 LAB — POCT URINALYSIS DIPSTICK OB
Bilirubin, UA: NEGATIVE
Blood, UA: NEGATIVE
Glucose, UA: NEGATIVE
Ketones, UA: NEGATIVE
Leukocytes, UA: NEGATIVE
Nitrite, UA: NEGATIVE
POC,PROTEIN,UA: NEGATIVE
Spec Grav, UA: 1.015 (ref 1.010–1.025)
Urobilinogen, UA: 0.2 E.U./dL
pH, UA: 5 (ref 5.0–8.0)

## 2019-04-26 NOTE — Progress Notes (Signed)
ROB doing well. 36 wk labs today. Pt complains of thin discharge. Nitrazine negatvie, no fluid noted on exam. SVE 1/50/-2. Seh complains of swelling, occasional headaches and sometimes spots. Labs today. Follow up 1 wk.   Doreene Burke, CNM

## 2019-04-26 NOTE — Patient Instructions (Signed)
Group B Streptococcus Infection During Pregnancy °Group B Streptococcus (GBS) is a type of bacteria that is often found in healthy people. It is commonly found in the rectum, vagina, and intestines. In people who are healthy and not pregnant, the bacteria rarely cause serious illness or complications. However, women who test positive for GBS during pregnancy can pass the bacteria to the baby during childbirth. This can cause serious infection in the baby after birth. °Women with GBS may also have infections during their pregnancy or soon after childbirth. The infections include urinary tract infections (UTIs) or infections of the uterus. GBS also increases a woman's risk of complications during pregnancy, such as early labor or delivery, miscarriage, or stillbirth. Routine testing for GBS is recommended for all pregnant women. °What are the causes? °This condition is caused by bacteria called Streptococcus agalactiae. °What increases the risk? °You may have a higher risk for GBS infection during pregnancy if you had one during a past pregnancy. °What are the signs or symptoms? °In most cases, GBS infection does not cause symptoms in pregnant women. If symptoms exist, they may include: °· Labor that starts before the 37th week of pregnancy. °· A UTI or bladder infection. This may cause a fever, frequent urination, or pain and burning during urination. °· Fever during labor. There can also be a rapid heartbeat in the mother or baby. °Rare but serious symptoms of a GBS infection in women include: °· Blood infection (septicemia). This may cause fever, chills, or confusion. °· Lung infection (pneumonia). This may cause fever, chills, cough, rapid breathing, chest pain, or difficulty breathing. °· Bone, joint, skin, or soft tissue infection. °How is this diagnosed? °You may be screened for GBS between week 35 and week 37 of pregnancy. If you have symptoms of preterm labor, you may be screened earlier. This condition is  diagnosed based on lab test results from: °· A swab of fluid from the vagina and rectum. °· A urine sample. °How is this treated? °This condition is treated with antibiotic medicine. Antibiotic medicine may be given: °· To you when you go into labor, or as soon as your water breaks. The medicines will continue until after you give birth. If you are having a cesarean delivery, you do not need antibiotics unless your water has broken. °· To your baby, if he or she requires treatment. Your health care provider will check your baby to decide if he or she needs antibiotics to prevent a serious infection. °Follow these instructions at home: °· Take over-the-counter and prescription medicines only as told by your health care provider. °· Take your antibiotic medicine as told by your health care provider. Do not stop taking the antibiotic even if you start to feel better. °· Keep all pre-birth (prenatal) visits and follow-up visits as told by your health care provider. This is important. °Contact a health care provider if: °· You have pain or burning when you urinate. °· You have to urinate more often than usual. °· You have a fever or chills. °· You develop a bad-smelling vaginal discharge. °Get help right away if: °· Your water breaks. °· You go into labor. °· You have severe pain in your abdomen. °· You have difficulty breathing. °· You have chest pain. °These symptoms may represent a serious problem that is an emergency. Do not wait to see if the symptoms will go away. Get medical help right away. Call your local emergency services (911 in the [place] Do not drive yourself to   the hospital. °Summary °· GBS is a type of bacteria that is common in healthy people. °· During pregnancy, colonization with GBS can cause serious complications for you or your baby. °· Your health care provider will screen you between 35 and 37 weeks of pregnancy to determine if you are colonized with GBS. °· If you are colonized with GBS during  pregnancy, your health care provider will recommend antibiotics through an IV during labor. °· After delivery, your baby will be evaluated for complications related to potential GBS infection and may require antibiotics to prevent a serious infection. °This information is not intended to replace advice given to you by your health care provider. Make sure you discuss any questions you have with your health care provider. °Document Revised: 10/11/2018 Document Reviewed: 10/11/2018 °Elsevier Patient Education © 2020 Elsevier Inc. ° °

## 2019-04-27 LAB — COMPREHENSIVE METABOLIC PANEL
ALT: 8 IU/L (ref 0–32)
AST: 16 IU/L (ref 0–40)
Albumin/Globulin Ratio: 1.6 (ref 1.2–2.2)
Albumin: 3.5 g/dL — ABNORMAL LOW (ref 3.9–5.0)
Alkaline Phosphatase: 72 IU/L (ref 39–117)
BUN/Creatinine Ratio: 7 — ABNORMAL LOW (ref 9–23)
BUN: 5 mg/dL — ABNORMAL LOW (ref 6–20)
Bilirubin Total: 0.4 mg/dL (ref 0.0–1.2)
CO2: 22 mmol/L (ref 20–29)
Calcium: 8.8 mg/dL (ref 8.7–10.2)
Chloride: 105 mmol/L (ref 96–106)
Creatinine, Ser: 0.7 mg/dL (ref 0.57–1.00)
GFR calc Af Amer: 138 mL/min/{1.73_m2} (ref >59)
GFR calc non Af Amer: 120 mL/min/{1.73_m2} (ref >59)
Globulin, Total: 2.2 g/dL (ref 1.5–4.5)
Glucose: 70 mg/dL (ref 65–99)
Potassium: 3.8 mmol/L (ref 3.5–5.2)
Sodium: 137 mmol/L (ref 134–144)
Total Protein: 5.7 g/dL — ABNORMAL LOW (ref 6.0–8.5)

## 2019-04-27 LAB — PROTEIN / CREATININE RATIO, URINE
Creatinine, Urine: 20.3 mg/dL
Protein, Ur: <4 mg/dL

## 2019-04-27 LAB — CBC WITH DIFFERENTIAL/PLATELET
Basophils Absolute: 0 10*3/uL (ref 0.0–0.2)
Basos: 0 %
EOS (ABSOLUTE): 0 10*3/uL (ref 0.0–0.4)
Eos: 1 %
Hematocrit: 39 % (ref 34.0–46.6)
Hemoglobin: 12.6 g/dL (ref 11.1–15.9)
Immature Grans (Abs): 0 10*3/uL (ref 0.0–0.1)
Immature Granulocytes: 1 %
Lymphocytes Absolute: 1 10*3/uL (ref 0.7–3.1)
Lymphs: 14 %
MCH: 30.6 pg (ref 26.6–33.0)
MCHC: 32.3 g/dL (ref 31.5–35.7)
MCV: 95 fL (ref 79–97)
Monocytes Absolute: 0.4 10*3/uL (ref 0.1–0.9)
Monocytes: 6 %
Neutrophils Absolute: 5.5 10*3/uL (ref 1.4–7.0)
Neutrophils: 78 %
Platelets: 206 10*3/uL (ref 150–450)
RBC: 4.12 x10E6/uL (ref 3.77–5.28)
RDW: 12.3 % (ref 11.7–15.4)
WBC: 7 10*3/uL (ref 3.4–10.8)

## 2019-04-28 LAB — GC/CHLAMYDIA PROBE AMP
Chlamydia trachomatis, NAA: NEGATIVE
Neisseria Gonorrhoeae by PCR: NEGATIVE

## 2019-04-28 LAB — STREP GP B NAA: Strep Gp B NAA: POSITIVE — AB

## 2019-05-04 ENCOUNTER — Encounter: Payer: Medicaid Other | Admitting: Certified Nurse Midwife

## 2019-05-04 ENCOUNTER — Encounter: Payer: BC Managed Care – PPO | Admitting: Certified Nurse Midwife

## 2019-05-05 ENCOUNTER — Telehealth: Payer: Self-pay

## 2019-05-05 NOTE — Telephone Encounter (Signed)
mychart message sent to patient

## 2019-05-05 NOTE — Telephone Encounter (Signed)
-----   Message from Doreene Burke, [place] sent at 05/04/2019  9:08 PM EST ----- [employer] reached out to me she was supposed to have an appointment with me and they said I hand nothing until her next appointment. Well, my 0945 on Friday is delivered so she can have that slot. Please call her and see if she can come in at that time.   Thanks  Pattricia Boss

## 2019-05-06 ENCOUNTER — Encounter: Payer: Self-pay | Admitting: Certified Nurse Midwife

## 2019-05-06 ENCOUNTER — Other Ambulatory Visit: Payer: Self-pay

## 2019-05-06 ENCOUNTER — Ambulatory Visit (INDEPENDENT_AMBULATORY_CARE_PROVIDER_SITE_OTHER): Payer: Medicaid Other | Admitting: Certified Nurse Midwife

## 2019-05-06 VITALS — BP 123/69 | HR 94 | Wt 190.4 lb

## 2019-05-06 DIAGNOSIS — Z3A37 37 weeks gestation of pregnancy: Secondary | ICD-10-CM | POA: Diagnosis not present

## 2019-05-06 DIAGNOSIS — O368130 Decreased fetal movements, third trimester, not applicable or unspecified: Secondary | ICD-10-CM | POA: Diagnosis not present

## 2019-05-06 DIAGNOSIS — Z3493 Encounter for supervision of normal pregnancy, unspecified, third trimester: Secondary | ICD-10-CM

## 2019-05-06 LAB — POCT URINALYSIS DIPSTICK OB
Bilirubin, UA: NEGATIVE
Blood, UA: NEGATIVE
Glucose, UA: NEGATIVE
Ketones, UA: NEGATIVE
Leukocytes, UA: NEGATIVE
Nitrite, UA: NEGATIVE
POC,PROTEIN,UA: NEGATIVE
Spec Grav, UA: >=1.03 — AB (ref 1.010–1.025)
Urobilinogen, UA: 0.2 E.U./dL
pH, UA: 5 (ref 5.0–8.0)

## 2019-05-06 NOTE — Progress Notes (Signed)
ROB doing well. NST today for concerns about fetal movement. Pt request elective induction for child care indications. Discussed with MDs attending in agreement. Scheduled for 2/19 @ 0300. Reviewed with pt and Covid testing. SVE per pt request 2-3/60-2.   NST INTERPRETATION: reactive   Indications: decreased fetal movement                    Impression: reactive Baseline 150 Accelerations present Decelerations absent Moderate variability  Contractions absent.   Plan: Follow up as scheduled.    Doreene Burke, CNM

## 2019-05-06 NOTE — Patient Instructions (Signed)
Nonstress Test A nonstress test is a procedure that is done during pregnancy in order to check the baby's heartbeat. The procedure can help show if the baby (fetus) is healthy. It is commonly done when:  The baby is past his or her due date.  The pregnancy is high risk.  The baby is moving less than normal.  The mother has lost a pregnancy in the past.  The health care provider suspects a problem with the baby's growth.  There is too much or too little amniotic fluid. The procedure is often done in the third trimester of pregnancy to find out if an early delivery is needed and whether such a delivery is safe. During a nonstress test, the baby's heartbeat is monitored when the baby is resting and when the baby is moving. If the baby is healthy, the heart rate will increase when he or she moves or kicks and will return to normal when he or she rests. Tell a health care provider about:  Any allergies you have.  Any medical conditions you have.  All medicines you are taking, including vitamins, herbs, eye drops, creams, and over-the-counter medicines. What are the risks? There are no risks to you or your baby from a nonstress test. This procedure should not be painful or uncomfortable. What happens before the procedure?  Eat a meal right before the test or as directed by your health care provider. Food may help encourage the baby to move.  Use the restroom right before the test. What happens during the procedure?  Two monitors will be placed on your abdomen. One will record the baby's heart rate and the other will record the contractions of your uterus.  You may be asked to lie down on your side or to sit upright.  You may be given a button to press when you feel your baby move.  Your health care provider will listen to your baby's heartbeat and recorded it. He or she may also watch your baby's heartbeat on a screen.  If the baby seems to be sleeping, you may be asked to drink  some juice or soda, eat a snack, or change positions. The procedure may vary among health care providers and hospitals. What happens after the procedure?  Your health care provider will discuss the test results with you and make recommendations for the future. Depending on the results, your health care provider may order additional tests or another course of action.  If your health care provider gave you any diet or activity instructions, make sure to follow them.  Keep all follow-up visits as told by your health care provider. This is important. Summary  A nonstress test is a procedure that is done during pregnancy in order to check the baby's heartbeat. The procedure can help show if the baby is healthy.  The procedure is often done in the third trimester of pregnancy to find out if an early delivery is needed and whether such a delivery is safe.  During a nonstress test, the baby's heartbeat is monitored when the baby is resting and when the baby is moving. If the baby is healthy, the heart rate will increase when he or she moves or kicks and will return to normal when he or she rests.  Your health care provider will discuss the test results with you and make recommendations for the future. This information is not intended to replace advice given to you by your health care provider. Make sure you discuss any   questions you have with your health care provider. Document Revised: 06/26/2016 Document Reviewed: 06/26/2016 Elsevier Patient Education  2020 Elsevier Inc.  

## 2019-05-10 ENCOUNTER — Encounter: Payer: Medicaid Other | Admitting: Certified Nurse Midwife

## 2019-05-11 ENCOUNTER — Encounter: Payer: Self-pay | Admitting: Obstetrics and Gynecology

## 2019-05-11 ENCOUNTER — Other Ambulatory Visit: Payer: Self-pay

## 2019-05-11 ENCOUNTER — Observation Stay
Admission: EM | Admit: 2019-05-11 | Discharge: 2019-05-11 | Disposition: A | Discharge status: Home / Self Care | Payer: BC Managed Care – PPO | Source: Home / Self Care | Admitting: Certified Nurse Midwife

## 2019-05-11 DIAGNOSIS — O09299 Supervision of pregnancy with other poor reproductive or obstetric history, unspecified trimester: Secondary | ICD-10-CM

## 2019-05-11 DIAGNOSIS — Z0371 Encounter for suspected problem with amniotic cavity and membrane ruled out: Secondary | ICD-10-CM | POA: Insufficient documentation

## 2019-05-11 DIAGNOSIS — Z3A38 38 weeks gestation of pregnancy: Secondary | ICD-10-CM | POA: Insufficient documentation

## 2019-05-11 DIAGNOSIS — Z8632 Personal history of gestational diabetes: Secondary | ICD-10-CM

## 2019-05-11 LAB — COMPREHENSIVE METABOLIC PANEL
ALT: 13 U/L (ref 0–44)
AST: 20 U/L (ref 15–41)
Albumin: 3.3 g/dL — ABNORMAL LOW (ref 3.5–5.0)
Alkaline Phosphatase: 88 U/L (ref 38–126)
Anion gap: 8 (ref 5–15)
BUN: 10 mg/dL (ref 6–20)
CO2: 23 mmol/L (ref 22–32)
Calcium: 9.1 mg/dL (ref 8.9–10.3)
Chloride: 106 mmol/L (ref 98–111)
Creatinine, Ser: 0.65 mg/dL (ref 0.44–1.00)
GFR calc Af Amer: >60 mL/min (ref >60)
GFR calc non Af Amer: >60 mL/min (ref >60)
Glucose, Bld: 78 mg/dL (ref 70–99)
Potassium: 3.9 mmol/L (ref 3.5–5.1)
Sodium: 137 mmol/L (ref 135–145)
Total Bilirubin: 0.9 mg/dL (ref 0.3–1.2)
Total Protein: 6.5 g/dL (ref 6.5–8.1)

## 2019-05-11 LAB — CBC WITH DIFFERENTIAL/PLATELET
Abs Immature Granulocytes: 0.06 10*3/uL (ref 0.00–0.07)
Basophils Absolute: 0 10*3/uL (ref 0.0–0.1)
Basophils Relative: 0 %
Eosinophils Absolute: 0 10*3/uL (ref 0.0–0.5)
Eosinophils Relative: 0 %
HCT: 42 % (ref 36.0–46.0)
Hemoglobin: 14 g/dL (ref 12.0–15.0)
Immature Granulocytes: 1 %
Lymphocytes Relative: 13 %
Lymphs Abs: 1.3 10*3/uL (ref 0.7–4.0)
MCH: 31.4 pg (ref 26.0–34.0)
MCHC: 33.3 g/dL (ref 30.0–36.0)
MCV: 94.2 fL (ref 80.0–100.0)
Monocytes Absolute: 0.7 10*3/uL (ref 0.1–1.0)
Monocytes Relative: 8 %
Neutro Abs: 7.4 10*3/uL (ref 1.7–7.7)
Neutrophils Relative %: 78 %
Platelets: 190 10*3/uL (ref 150–400)
RBC: 4.46 MIL/uL (ref 3.87–5.11)
RDW: 12.5 % (ref 11.5–15.5)
WBC: 9.5 10*3/uL (ref 4.0–10.5)
nRBC: 0 % (ref 0.0–0.2)

## 2019-05-11 LAB — PROTEIN / CREATININE RATIO, URINE
Creatinine, Urine: 11 mg/dL
Total Protein, Urine: <6 mg/dL

## 2019-05-11 LAB — RUPTURE OF MEMBRANE (ROM)PLUS: Rom Plus: NEGATIVE

## 2019-05-11 NOTE — Progress Notes (Signed)
05/11/2019 8:15 PM  BP 120/72   Pulse (!) 107   Temp 98.1 F (36.7 C) (Oral)   Resp 16   Ht 5\' 7"  (170.2 cm)   Wt 86.2 kg   LMP 08/12/2018 (Exact Date)   BMI 29.76 kg/m  Patient discharged per MD orders. Discharge instructions reviewed with patient and patient verbalized understanding. Early labor signs reviewed with patient. Discharged ambulatory with belongings in hand.  08/14/2018, RN

## 2019-05-11 NOTE — OB Triage Note (Signed)
L&D OB Triage Note  SUBJECTIVE Jasmine Edwards is a 29 y.o. [phone] female at [redacted]w[redacted]d, EDD Estimated Date of Delivery: 05/23/19 who presented to triage with complaints of contractions and leaking of fluid since noon today. She feels good movement.   OB History  Gravida Para Term Preterm AB Living  4 2 2  0 1 2  SAB TAB Ectopic Multiple Live Births  1 0 0 0 2    # Outcome Date GA Lbr Len/2nd Weight Sex Delivery Anes PTL Lv  4 Current           3 SAB 06/03/18 [redacted]w[redacted]d         2 Term 04/20/17 [redacted]w[redacted]d / 00:23 4540 g F Vag-Spont EPI  LIV     Name: CHISOM, MUNTEAN     Apgar1: 8  Apgar5: 9  1 Term 10/26/13 [redacted]w[redacted]d  3374 g M Vag-Spont  Y LIV    Obstetric Comments  G1 - GDM (diet controlled). PTL at 28 weeks, arrested. IOL at 40 weeks.     Medications Prior to Admission  Medication Sig Dispense Refill Last Dose  . Prenatal Vit-Fe Fumarate-FA (PRENATAL MULTIVITAMIN) TABS tablet Take 1 tablet by mouth daily at 12 noon.   05/10/2019 at Unknown time     OBJECTIVE  Nursing Evaluation:   BP 124/76   Pulse 97   Temp 98.5 F (36.9 C) (Oral)   Resp 16   Ht 5\' 7"  (1.702 m)   Wt 86.2 kg   LMP 08/12/2018 (Exact Date)   BMI 29.76 kg/m    Findings:   Intact membranes, reactive NST, irregular contractions      NST was performed and has been reviewed by me.  NST INTERPRETATION: Category I  Mode: External Baseline Rate (A): 130 bpm Variability: Moderate Accelerations: 15 x 15 Decelerations: None     Contraction Frequency (min): 5-8  CMP Latest Ref Rng & Units 05/11/2019 04/26/2019 03/23/2019  Glucose 70 - 99 mg/dL 78 70 76  BUN 6 - 20 mg/dL 10 5(L) 9  Creatinine 0.44 - 1.00 mg/dL 0.65 0.70 0.57  Sodium 135 - 145 mmol/L 137 137 136  Potassium 3.5 - 5.1 mmol/L 3.9 3.8 4.0  Chloride 98 - 111 mmol/L 106 105 105  CO2 22 - 32 mmol/L 23 22 20   Calcium 8.9 - 10.3 mg/dL 9.1 8.8 8.9  Total Protein 6.5 - 8.1 g/dL 6.5 5.7(L) 5.9(L)  Total Bilirubin 0.3 - 1.2 mg/dL 0.9 0.4 0.2    Alkaline Phos 38 - 126 U/L 88 72 55  AST 15 - 41 U/L 20 16 14   ALT 0 - 44 U/L 13 8 8    CBC Latest Ref Rng & Units 05/11/2019 04/26/2019 03/23/2019  WBC 4.0 - 10.5 K/uL 9.5 7.0 8.1  Hemoglobin 12.0 - 15.0 g/dL 14.0 12.6 12.5  Hematocrit 36.0 - 46.0 % 42.0 39.0 37.6  Platelets 150 - 400 K/uL 190 206 230   Creatinine, Urine mg/dL 11  74.6 R   Total Protein, Urine mg/dL <6    Comment: NO NORMAL RANGE ESTABLISHED FOR THIS TEST  Protein Creatinine Ratio 0.00 - 0.15 mg/mg       Comment: RESULT BELOW REPORTABLE RANGE,  UNABLE TO CALCULATE.     ASSESSMENT Impression:  1.  Pregnancy:  [id number] at [redacted]w[redacted]d , EDD Estimated Date of Delivery: 05/23/19 2.  Reassuring fetal and maternal status 3.  ROM plus negative 4 BP elevated on admission: pre eclampsia labs negative  PLAN 1. Discussed current condition and  above findings with patient and reassurance given.  All questions answered. 2. Discharge home with standard labor precautions given to return to L&D or call the office for problems. 3. Continue routine prenatal care.  Doreene Burke, CNM

## 2019-05-11 NOTE — Discharge Instructions (Signed)
First Stage of Labor °Labor is your body's natural process of moving your baby and other structures, including the placenta and umbilical cord, out of your uterus. There are three stages of labor. How long each stage lasts is different for every woman. But certain events happen during each stage that are the same for everyone. °· The first stage starts when true labor begins. This stage ends when your cervix, which is the opening from your uterus into your vagina, is completely open (dilated). °· The second stage begins when your cervix is fully dilated and you start pushing. This stage ends when your baby is born. °· The third stage is the delivery of the organ that nourished your baby during pregnancy (placenta). °First stage of labor °As your due date gets closer, you may start to notice certain physical changes that mean labor is going to start soon. You may feel that your baby has dropped lower into your pelvis. You may experience irregular, often painless, contractions that go away when you walk around or lie down (Braxton Hicks contractions). This is also called false labor. °The first stage of labor begins when you start having contractions that come at regular (evenly spaced) intervals and your cervix starts to get thinner and wider in preparation for your baby to pass through. Birth care providers measure the dilation of your cervix in centimeters (cm). One centimeter is a little less than one-half of an inch. The first stage ends when your cervix is dilated to 10 cm. The first stage of labor is divided into three phases: °· Early phase. °· Active phase. °· Transitional phase. °The length of the first stage of labor varies. It may be longer if this is your first pregnancy. You may spend most of this stage at home trying to relax and stay comfortable. °How does this affect me? °During the first stage of labor, you will move through three phases. °What happens in the early phase? °· You will start to have  regular contractions that last 30-60 seconds. Contractions may come every 5-20 minutes. Keep track of your contractions and call your birth care provider. °· Your water may break during this phase. °· You may notice a clear or slightly bloody discharge of mucus (mucus plug) from your vagina. °· Your cervix will dilate to 3-6 cm. °What happens in the active phase? °The active phase usually lasts 3-5 hours. You may go to the hospital or birth center around this time. During the active phase: °· Your contractions will become stronger, longer, and more uncomfortable. °· Your contractions may last 45-90 seconds and come every 3-5 minutes. °· You may feel lower back pain. °· Your birth care providers may examine your cervix and feel your belly to find the position of your baby. °· You may have a monitor strapped to your belly to measure your contractions and your baby's heart rate. °· You may start using your pain management options. °· Your cervix may be dilated to 6 cm and may start to dilate more quickly. °What happens in the transitional phase? °The transitional phase typically lasts from 30 minutes to 2 hours. At the end of this phase, your cervix will be fully dilated to 10 cm. During the transitional phase: °· Contractions will get stronger and longer. °· Contractions may last 60-90 seconds and come less than 2 minutes apart. °· You may feel hot flashes, chills, or nausea. °How does this affect my baby? °During the first stage of labor, your baby will   gradually move down into your birth canal. °Follow these instructions at home and in the hospital or birth center: ° °· When labor first begins, try to stay calm. You are still in the early phase. If it is night, try to get some sleep. If it is day, try to relax and save your energy. You may want to make some calls and get ready to go to the hospital or birth center. °· When you are in the early phase, try these methods to help ease discomfort: °? Deep breathing and  muscle relaxation. °? Taking a walk. °? Taking a warm bath or shower. °· Drink some fluids and have a light snack if you feel like it. °· Keep track of your contractions. °· Based on the plan you created with your birth care provider, call when your contractions indicate it is time. °· If your water breaks, note the time, color, and odor of the fluid. °· When you are in the active phase, do your breathing exercises and rely on your support people and your team of birth care providers. °Contact a health care provider if: °· Your contractions are strong and regular. °· You have lower back pain or cramping. °· Your water breaks. °· You lose your mucus plug. °Get help right away if you: °· Have a severe headache that does not go away. °· Have changes in your vision. °· Have severe pain in your upper belly. °· Do not feel the baby move. °· Have bright red bleeding. °Summary °· The first stage of labor starts when true labor begins, and it ends when your cervix is dilated to 10 cm. °· The first stage of labor has three phases: early, active, and transitional. °· Your baby moves into the birth canal during the first stage of labor. °· You may have contractions that become stronger and longer. You may also lose your mucus plug and have your water break. °· Call your birth care provider when your contractions are frequent and strong enough to go to the hospital or birth center. °This information is not intended to replace advice given to you by your health care provider. Make sure you discuss any questions you have with your health care provider. °Document Revised: 07/08/2018 Document Reviewed: 05/31/2017 °Elsevier Patient Education © 2020 [employer] ° °

## 2019-05-11 NOTE — OB Triage Note (Signed)
Pt is a 28yo G4P2 at [redacted]w[redacted]d that presents from the ED with c/o ctx and LOF that started around noon today. Pt states the ctx are every 5-6 minutes and feel more intense in the last hour. Pt states positive FM and noticed some brown spotting. Monitors applied and initial FHT 130.

## 2019-05-12 ENCOUNTER — Inpatient Hospital Stay: Payer: BC Managed Care – PPO | Admitting: Anesthesiology

## 2019-05-12 ENCOUNTER — Inpatient Hospital Stay
Admission: EM | Admit: 2019-05-12 | Discharge: 2019-05-13 | DRG: 807 | Disposition: A | Discharge status: Home / Self Care | Payer: BC Managed Care – PPO | Attending: Certified Nurse Midwife | Admitting: Certified Nurse Midwife

## 2019-05-12 ENCOUNTER — Encounter: Payer: Self-pay | Admitting: Obstetrics and Gynecology

## 2019-05-12 DIAGNOSIS — Z3A00 Weeks of gestation of pregnancy not specified: Secondary | ICD-10-CM | POA: Diagnosis not present

## 2019-05-12 DIAGNOSIS — Z20822 Contact with and (suspected) exposure to covid-19: Secondary | ICD-10-CM | POA: Diagnosis present

## 2019-05-12 DIAGNOSIS — Z3A38 38 weeks gestation of pregnancy: Secondary | ICD-10-CM | POA: Diagnosis not present

## 2019-05-12 DIAGNOSIS — O26893 Other specified pregnancy related conditions, third trimester: Secondary | ICD-10-CM | POA: Diagnosis present

## 2019-05-12 DIAGNOSIS — Z87891 Personal history of nicotine dependence: Secondary | ICD-10-CM

## 2019-05-12 DIAGNOSIS — Z3490 Encounter for supervision of normal pregnancy, unspecified, unspecified trimester: Secondary | ICD-10-CM

## 2019-05-12 LAB — CBC WITH DIFFERENTIAL/PLATELET
Abs Immature Granulocytes: 0.1 10*3/uL — ABNORMAL HIGH (ref 0.00–0.07)
Basophils Absolute: 0.1 10*3/uL (ref 0.0–0.1)
Basophils Relative: 0 %
Eosinophils Absolute: 0 10*3/uL (ref 0.0–0.5)
Eosinophils Relative: 0 %
HCT: 40.8 % (ref 36.0–46.0)
Hemoglobin: 13.8 g/dL (ref 12.0–15.0)
Immature Granulocytes: 1 %
Lymphocytes Relative: 6 %
Lymphs Abs: 1 10*3/uL (ref 0.7–4.0)
MCH: 31.2 pg (ref 26.0–34.0)
MCHC: 33.8 g/dL (ref 30.0–36.0)
MCV: 92.1 fL (ref 80.0–100.0)
Monocytes Absolute: 0.9 10*3/uL (ref 0.1–1.0)
Monocytes Relative: 6 %
Neutro Abs: 12.9 10*3/uL — ABNORMAL HIGH (ref 1.7–7.7)
Neutrophils Relative %: 87 %
Platelets: 144 10*3/uL — ABNORMAL LOW (ref 150–400)
RBC: 4.43 MIL/uL (ref 3.87–5.11)
RDW: 12.5 % (ref 11.5–15.5)
WBC: 14.9 10*3/uL — ABNORMAL HIGH (ref 4.0–10.5)
nRBC: 0 % (ref 0.0–0.2)

## 2019-05-12 LAB — RESPIRATORY PANEL BY RT PCR (FLU A&B, COVID)
Influenza A by PCR: NEGATIVE
Influenza B by PCR: NEGATIVE
SARS Coronavirus 2 by RT PCR: NEGATIVE

## 2019-05-12 LAB — RAPID HIV SCREEN (HIV 1/2 AB+AG)
HIV 1/2 Antibodies: NONREACTIVE
HIV-1 P24 Antigen - HIV24: NONREACTIVE

## 2019-05-12 LAB — RPR: RPR Ser Ql: NONREACTIVE

## 2019-05-12 MED ORDER — LIDOCAINE HCL (PF) 1 % IJ SOLN
INTRAMUSCULAR | Status: AC
  Filled 2019-05-12: qty 30

## 2019-05-12 MED ORDER — LACTATED RINGERS IV SOLN: 500.0000 mL | INTRAVENOUS | Status: DC | PRN

## 2019-05-12 MED ORDER — OXYCODONE-ACETAMINOPHEN 5-325 MG PO TABS: 2.0000 | ORAL_TABLET | ORAL | Status: DC | PRN

## 2019-05-12 MED ORDER — AMMONIA AROMATIC IN INHA
RESPIRATORY_TRACT | Status: AC
  Filled 2019-05-12: qty 10

## 2019-05-12 MED ORDER — OXYTOCIN 10 UNIT/ML IJ SOLN
INTRAMUSCULAR | Status: AC
  Filled 2019-05-12: qty 2

## 2019-05-12 MED ORDER — SODIUM CHLORIDE 0.9 % IV SOLN
INTRAVENOUS | Status: DC | PRN
  Administered 2019-05-12: 10 mL via EPIDURAL

## 2019-05-12 MED ORDER — COCONUT OIL OIL
1.0000 "application " | TOPICAL_OIL | Status: DC | PRN
  Filled 2019-05-12 (×2): qty 120

## 2019-05-12 MED ORDER — MISOPROSTOL 200 MCG PO TABS
ORAL_TABLET | ORAL | Status: AC
  Filled 2019-05-12: qty 4

## 2019-05-12 MED ORDER — SOD CITRATE-CITRIC ACID 500-334 MG/5ML PO SOLN: 30.0000 mL | ORAL | Status: DC | PRN

## 2019-05-12 MED ORDER — ONDANSETRON HCL 4 MG/2ML IJ SOLN: 4.0000 mg | Freq: Four times a day (QID) | INTRAMUSCULAR | Status: DC | PRN

## 2019-05-12 MED ORDER — LIDOCAINE HCL (PF) 1 % IJ SOLN: 30.0000 mL | INTRAMUSCULAR | Status: DC | PRN

## 2019-05-12 MED ORDER — SIMETHICONE 80 MG PO CHEW: 80.0000 mg | CHEWABLE_TABLET | ORAL | Status: DC | PRN

## 2019-05-12 MED ORDER — WITCH HAZEL-GLYCERIN EX PADS: 1.0000 "application " | MEDICATED_PAD | CUTANEOUS | Status: DC | PRN

## 2019-05-12 MED ORDER — OXYTOCIN BOLUS FROM INFUSION
500.0000 mL | Freq: Once | INTRAVENOUS | Status: AC
  Administered 2019-05-12: 500 mL via INTRAVENOUS

## 2019-05-12 MED ORDER — BENZOCAINE-MENTHOL 20-0.5 % EX AERO: 1.0000 "application " | INHALATION_SPRAY | CUTANEOUS | Status: DC | PRN

## 2019-05-12 MED ORDER — OXYCODONE-ACETAMINOPHEN 5-325 MG PO TABS: 1.0000 | ORAL_TABLET | ORAL | Status: DC | PRN

## 2019-05-12 MED ORDER — FENTANYL 2.5 MCG/ML W/ROPIVACAINE 0.15% IN NS 100 ML EPIDURAL (ARMC)
EPIDURAL | Status: AC
  Filled 2019-05-12: qty 100

## 2019-05-12 MED ORDER — ACETAMINOPHEN 325 MG PO TABS: 650.0000 mg | ORAL_TABLET | ORAL | Status: DC | PRN

## 2019-05-12 MED ORDER — ONDANSETRON HCL 4 MG PO TABS: 4.0000 mg | ORAL_TABLET | ORAL | Status: DC | PRN

## 2019-05-12 MED ORDER — OXYTOCIN 40 UNITS IN NORMAL SALINE INFUSION - SIMPLE MED: 2.5000 [IU]/h | INTRAVENOUS | Status: DC

## 2019-05-12 MED ORDER — FERROUS SULFATE 325 (65 FE) MG PO TABS
325.0000 mg | ORAL_TABLET | Freq: Every day | ORAL | Status: DC
  Administered 2019-05-12 – 2019-05-13 (×2): 325 mg via ORAL
  Filled 2019-05-12 (×2): qty 1

## 2019-05-12 MED ORDER — EPHEDRINE 5 MG/ML INJ
10.0000 mg | INTRAVENOUS | Status: DC | PRN
  Filled 2019-05-12: qty 2

## 2019-05-12 MED ORDER — IBUPROFEN 600 MG PO TABS
600.0000 mg | ORAL_TABLET | Freq: Four times a day (QID) | ORAL | Status: DC
  Administered 2019-05-12 – 2019-05-13 (×6): 600 mg via ORAL
  Filled 2019-05-12 (×6): qty 1

## 2019-05-12 MED ORDER — LACTATED RINGERS IV SOLN: 500.0000 mL | Freq: Once | INTRAVENOUS | Status: DC

## 2019-05-12 MED ORDER — LIDOCAINE HCL (PF) 1 % IJ SOLN
INTRAMUSCULAR | Status: DC | PRN
  Administered 2019-05-12: 2 mL via SUBCUTANEOUS
  Administered 2019-05-12: 3 mL via SUBCUTANEOUS

## 2019-05-12 MED ORDER — LACTATED RINGERS IV SOLN: INTRAVENOUS | Status: DC

## 2019-05-12 MED ORDER — PRENATAL MULTIVITAMIN CH
1.0000 | ORAL_TABLET | Freq: Every day | ORAL | Status: DC
  Administered 2019-05-12 – 2019-05-13 (×2): 1 via ORAL
  Filled 2019-05-12 (×2): qty 1

## 2019-05-12 MED ORDER — DIPHENHYDRAMINE HCL 50 MG/ML IJ SOLN: 12.5000 mg | INTRAMUSCULAR | Status: DC | PRN

## 2019-05-12 MED ORDER — SODIUM CHLORIDE 0.9 % IV SOLN
2.0000 g | INTRAVENOUS | Status: DC
  Filled 2019-05-12: qty 2000

## 2019-05-12 MED ORDER — SENNOSIDES-DOCUSATE SODIUM 8.6-50 MG PO TABS
2.0000 | ORAL_TABLET | ORAL | Status: DC
  Administered 2019-05-13: 2 via ORAL
  Filled 2019-05-12: qty 2

## 2019-05-12 MED ORDER — PHENYLEPHRINE 40 MCG/ML (10ML) SYRINGE FOR IV PUSH (FOR BLOOD PRESSURE SUPPORT)
80.0000 ug | PREFILLED_SYRINGE | INTRAVENOUS | Status: DC | PRN
  Filled 2019-05-12: qty 10

## 2019-05-12 MED ORDER — FENTANYL 2.5 MCG/ML W/ROPIVACAINE 0.15% IN NS 100 ML EPIDURAL (ARMC)
EPIDURAL | Status: DC | PRN
  Administered 2019-05-12: 12 mL/h via EPIDURAL

## 2019-05-12 MED ORDER — DOCUSATE SODIUM 100 MG PO CAPS
100.0000 mg | ORAL_CAPSULE | Freq: Two times a day (BID) | ORAL | Status: DC
  Administered 2019-05-12 – 2019-05-13 (×2): 100 mg via ORAL
  Filled 2019-05-12 (×2): qty 1

## 2019-05-12 MED ORDER — OXYTOCIN 40 UNITS IN NORMAL SALINE INFUSION - SIMPLE MED
INTRAVENOUS | Status: AC
  Filled 2019-05-12: qty 1000

## 2019-05-12 MED ORDER — FENTANYL 2.5 MCG/ML W/ROPIVACAINE 0.15% IN NS 100 ML EPIDURAL (ARMC): 12.0000 mL/h | EPIDURAL | Status: DC

## 2019-05-12 MED ORDER — DIBUCAINE (PERIANAL) 1 % EX OINT: 1.0000 "application " | TOPICAL_OINTMENT | CUTANEOUS | Status: DC | PRN

## 2019-05-12 MED ORDER — ONDANSETRON HCL 4 MG/2ML IJ SOLN: 4.0000 mg | INTRAMUSCULAR | Status: DC | PRN

## 2019-05-12 NOTE — OB Triage Note (Signed)
Patient arrived complaining of ctx every 3 mins. No leaking of fluid or vaginal bleeding. Positive fetal movement.

## 2019-05-12 NOTE — Anesthesia Procedure Notes (Signed)
Epidural Patient location during procedure: OB Start time: 05/12/2019 1:31 AM End time: 05/12/2019 1:42 AM  Staffing Anesthesiologist: Corinda Gubler, MD Performed: anesthesiologist   Preanesthetic Checklist Completed: patient identified, IV checked, site marked, risks and benefits discussed, surgical consent, monitors and equipment checked, pre-op evaluation and timeout performed  Epidural Patient position: sitting Prep: ChloraPrep Patient monitoring: heart rate, continuous pulse ox and blood pressure Approach: midline Location: L3-L4 Injection technique: LOR saline  Needle:  Needle type: Tuohy  Needle gauge: 17 G Needle length: 9 cm and 9 Needle insertion depth: 7 cm Catheter type: closed end flexible Catheter size: 19 Gauge Catheter at skin depth: 12 cm Test dose: negative and 1.5% lidocaine with Epi 1:200 K  Assessment Sensory level: T10 Events: blood not aspirated, injection not painful, no injection resistance, no paresthesia and negative IV test  Additional Notes 2 attempts Pt. Evaluated and documentation done after procedure finished. Patient identified. Risks/Benefits/Options discussed with patient including but not limited to bleeding, infection, nerve damage, paralysis, failed block, incomplete pain control, headache, blood pressure changes, nausea, vomiting, reactions to medication both or allergic, itching and postpartum back pain. Confirmed with bedside nurse the patient's most recent platelet count. Confirmed with patient that they are not currently taking any anticoagulation, have any bleeding history or any family history of bleeding disorders. Patient expressed understanding and wished to proceed. All questions were answered. Sterile technique was used throughout the entire procedure. Please see nursing notes for vital signs. Test dose was given through epidural catheter and negative prior to continuing to dose epidural or start infusion. Warning signs of high block  given to the patient including shortness of breath, tingling/numbness in hands, complete motor block, or any concerning symptoms with instructions to call for help. Patient was given instructions on fall risk and not to get out of bed. All questions and concerns addressed with instructions to call with any issues or inadequate analgesia.   Patient tolerated the insertion well without immediate complications.Reason for block:procedure for pain

## 2019-05-12 NOTE — Lactation Note (Signed)
This note was copied from a baby's chart. Lactation Consultation Note  Patient Name: Jasmine Edwards [id number] Date: 05/12/2019 Reason for consult: Initial assessment;Early term 37-38.6wks  LC in to see mom and baby. This is mom's third baby, and third time breastfeeding. She has 64yr BF experience with first baby, and 19 months with the second baby.  Baby fed well after delivery, but mom is unsure about feeding frequency, as she has attempted but baby is falling asleep. LC reviewed normal newborn behaviors, feeding patterns, stomach size. Reviewed early hunger cues, attempts at least 8-12x/24 hours, and options of stimulation through skin to skin and hand expression.  Mom reports already leaking from left breast, and fast let down history with same breast. LC provided guidance for hand expression and supplied collection containers, encouraging mom to empty breast if needed while baby rests. Reviewed anticipation of early transitional milk and mature milk, and possible over production based on mom's production history with other children; discussed management. [facility] name and number written on whiteboard, and mom encouraged to call out today with questions/concerns or for [facility] support.  Maternal Data Formula Feeding for Exclusion: No Has patient been taught Hand Expression?: Yes Does the patient have breastfeeding experience prior to this delivery?: Yes  Feeding Feeding Type: Breast Fed  LATCH Score                   Interventions Interventions: Breast feeding basics reviewed  Lactation Tools Discussed/Used     Consult Status Consult Status: Follow-up Date: 05/12/19 Follow-up type: In-patient    Danford Bad 05/12/2019, 9:16 AM

## 2019-05-12 NOTE — Anesthesia Preprocedure Evaluation (Signed)
Anesthesia Evaluation  Patient identified by MRN, date of birth, ID band Patient awake    Reviewed: Allergy & Precautions, NPO status , Patient's Chart, lab work & pertinent test results  History of Anesthesia Complications Negative for: history of anesthetic complications  Airway Mallampati: II  TM Distance: >3 FB Neck ROM: Full    Dental no notable dental hx. (+) Teeth Intact   Pulmonary neg pulmonary ROS, neg sleep apnea, neg COPD, Patient abstained from smoking.Not current smoker, former smoker,    Pulmonary exam normal breath sounds clear to auscultation       Cardiovascular Exercise Tolerance: Good METS(-) hypertension(-) CAD and (-) Past MI negative cardio ROS  (-) dysrhythmias  Rhythm:Regular Rate:Normal - Systolic murmurs    Neuro/Psych negative neurological ROS  negative psych ROS   GI/Hepatic neg GERD  ,(+)     (-) substance abuse  ,   Endo/Other  diabetes  Renal/GU negative Renal ROS     Musculoskeletal   Abdominal   Peds  Hematology   Anesthesia Other Findings Past Medical History: No date: Depression No date: Diabetes, gestational     Comment:  history  Reproductive/Obstetrics                             Anesthesia Physical Anesthesia Plan  ASA: II  Anesthesia Plan: Epidural   Post-op Pain Management:    Induction:   PONV Risk Score and Plan: 3 and Treatment may vary due to age or medical condition and Ondansetron  Airway Management Planned: Natural Airway  Additional Equipment:   Intra-op Plan:   Post-operative Plan:   Informed Consent: I have reviewed the patients History and Physical, chart, labs and discussed the procedure including the risks, benefits and alternatives for the proposed anesthesia with the patient or authorized representative who has indicated his/her understanding and acceptance.       Plan Discussed with: Surgeon  Anesthesia  Plan Comments: (Discussed R/B/A of neuraxial anesthesia technique with patient: - rare risks of spinal/epidural hematoma, nerve damage, infection - Risk of PDPH - Risk of itching - Risk of nausea and vomiting - Risk of poor block necessitating replacement of epidural. Patient voiced understanding.)        Anesthesia Quick Evaluation

## 2019-05-12 NOTE — Lactation Note (Signed)
This note was copied from a baby's chart. Lactation Consultation Note  Patient Name: [name] [id number] Date: 05/12/2019 Reason for consult: Follow-up assessment  LC in to check on mom and baby. Baby had just received bath, and mom and baby were skin to skin; baby content. Mom reporting baby's feedings have improved throughout the day. Reviewed potential for upcoming cluster feeding overnight, importance of putting to breast on cue, and benefits of skin to skin time.  Mom had no questions or concerns at this time.   Maternal Data Formula Feeding for Exclusion: No Has patient been taught Hand Expression?: Yes Does the patient have breastfeeding experience prior to this delivery?: Yes  Feeding Feeding Type: Breast Fed  LATCH Score                   Interventions Interventions: Breast feeding basics reviewed  Lactation Tools Discussed/Used     Consult Status Consult Status: Follow-up Date: 05/13/19 Follow-up type: In-patient    Danford Bad 05/12/2019, 4:15 PM

## 2019-05-13 ENCOUNTER — Telehealth: Payer: Self-pay

## 2019-05-13 ENCOUNTER — Encounter: Payer: Medicaid Other | Admitting: Certified Nurse Midwife

## 2019-05-13 LAB — TYPE AND SCREEN
ABO/RH(D): O NEG
Antibody Screen: POSITIVE

## 2019-05-13 LAB — FETAL SCREEN: Fetal Screen: NEGATIVE

## 2019-05-13 MED ORDER — IBUPROFEN 600 MG PO TABS: 600.0000 mg | ORAL_TABLET | Freq: Four times a day (QID) | ORAL | 0 refills | Status: DC

## 2019-05-13 MED ORDER — DOCUSATE SODIUM 100 MG PO CAPS: 100.0000 mg | ORAL_CAPSULE | Freq: Two times a day (BID) | ORAL | 0 refills | Status: DC

## 2019-05-13 MED ORDER — RHO D IMMUNE GLOBULIN 1500 UNIT/2ML IJ SOSY
300.0000 ug | PREFILLED_SYRINGE | Freq: Once | INTRAMUSCULAR | Status: AC
  Administered 2019-05-13: 300 ug via INTRAVENOUS
  Filled 2019-05-13: qty 2

## 2019-05-13 NOTE — Discharge Instructions (Signed)

## 2019-05-13 NOTE — Discharge Summary (Signed)
                              Discharge Summary  Date of Admission: 05/12/2019  Date of Discharge: 05/13/2019  Admitting Diagnosis: Onset of Labor at [redacted]w[redacted]d   Mode of Delivery: normal spontaneous vaginal delivery                 Discharge Diagnosis: No other diagnosis    Intrapartum Procedures: none   Post partum procedures: rhogam  Complications: none                      Discharge Day SOAP Note:  Progress Note - Vaginal Delivery  Jasmine Edwards is a 29 y.o. [id number] now PP day 1 s/p Vaginal, Spontaneous . Delivery was uncomplicated  Subjective  The patient has the following complaints: has no unusual complaints  Pain is controlled with current medications.   Patient is urinating without difficulty.  She is ambulating well.     Objective  Vital signs: BP 107/71 (BP Location: Left Arm)   Pulse 65   Temp 98.6 F (37 C) (Oral)   Resp 18   Ht 5\' 7"  (1.702 m)   Wt 86.2 kg   LMP 08/12/2018 (Exact Date)   SpO2 98% Comment: Room Air  Breastfeeding Unknown   BMI 29.76 kg/m   Physical Exam: Gen: NAD Fundus Fundal Tone: Firm  Lochia Amount: Small  Perineum Appearance: Intact     Data Review Labs: CBC Latest Ref Rng & Units 05/12/2019 05/11/2019 04/26/2019  WBC 4.0 - 10.5 K/uL 14.9(H) 9.5 7.0  Hemoglobin 12.0 - 15.0 g/dL 04/28/2019 86.7 67.2  Hematocrit 36.0 - 46.0 % 40.8 42.0 39.0  Platelets 150 - 400 K/uL 144(L) 190 206   O NEG  Assessment/Plan  Active Problems:   Pregnancy    Plan for discharge today.   Discharge Instructions: Per After Visit Summary. Activity: Advance as tolerated. Pelvic rest for 6 weeks.  Also refer to After Visit Summary Diet: Regular Medications: Allergies as of 05/13/2019   No Known Allergies     Medication List    TAKE these medications   docusate sodium 100 MG capsule Commonly known as: COLACE Take 1 capsule (100 mg total) by mouth 2 (two) times daily.   ibuprofen 600 MG tablet Commonly known as: ADVIL Take 1  tablet (600 mg total) by mouth every 6 (six) hours.   prenatal multivitamin Tabs tablet Take 1 tablet by mouth daily at 12 noon.      Outpatient follow up: 6 wks postpartum with 07/11/2019, CNM   Postpartum contraception: Husband getting Vasectomy  Discharged Condition: good  Discharged to: home  Newborn Data: Disposition:home with mother  Apgars: APGAR (1 MIN): 8   APGAR (5 MINS): 9   APGAR (10 MINS):    Baby Feeding: Breast    Doreene Burke, CNM  05/13/2019 2:58 PM

## 2019-05-13 NOTE — Lactation Note (Signed)
This note was copied from a baby's chart. Lactation Consultation Note  Patient Name: Jasmine Edwards [id number] Date: 05/13/2019 Reason for consult: Follow-up assessment 3rd baby, mom reports that this baby is latching better than other children, hears swallows when baby nurses, reports some burning and pain on left nipple, baby had more difficulty with this side, nipple more flat but mom states baby is latching easier now with shaping areola to get deeper latch, coconut oil for nipple soreness,  given with instruction       Maternal Data Formula Feeding for Exclusion: No Has patient been taught Hand Expression?: Yes Does the patient have breastfeeding experience prior to this delivery?: Yes  Feeding Feeding Type: (feeding not observed)  LATCH Score Latch: (feeding not observed)        Comfort (Breast/Nipple): Filling, red/small blisters or bruises, mild/mod discomfort        Interventions Interventions: Coconut oil  Lactation Tools Discussed/Used WIC Program: No   Consult Status Consult Status: PRN Date: 05/13/19 Follow-up type: In-patient  Probable d/c to home this pm  [name] 05/13/2019, 11:44 AM

## 2019-05-13 NOTE — Anesthesia Postprocedure Evaluation (Signed)
Anesthesia Post Note  Patient: Jasmine Edwards  Procedure(s) Performed: AN AD HOC LABOR EPIDURAL  Patient location during evaluation: Mother Baby Anesthesia Type: Epidural Level of consciousness: awake and alert Pain management: pain level controlled Vital Signs Assessment: post-procedure vital signs reviewed and stable Respiratory status: spontaneous breathing, nonlabored ventilation and respiratory function stable Cardiovascular status: stable Postop Assessment: no headache, no backache and epidural receding Anesthetic complications: no     Last Vitals:  Vitals:   05/12/19 1930 05/12/19 2318  BP: 113/77 113/76  Pulse: 70 67  Resp: 18 18  Temp: 36.9 C 36.8 C  SpO2: 99% 98%    Last Pain:  Vitals:   05/13/19 0445  TempSrc:   PainSc: 0-No pain                 Rosanne Gutting

## 2019-05-13 NOTE — Progress Notes (Signed)
Patient discharged home with infant. Discharge instructions, prescriptions and follow up appointment given to and reviewed with patient. Patient verbalized understanding. Waiting on ride 

## 2019-05-13 NOTE — Final Progress Note (Signed)
Discharge Day SOAP Note:  Progress Note - Vaginal Delivery  Jasmine Edwards is a 29 y.o. [phone] now PP day 1 s/p Vaginal, Spontaneous . Delivery was uncomplicated. She got an epidural in advanced labor that did not work for her. She did not get treatment for GBS due to precipitous delivery.   Subjective  The patient has the following complaints: has no unusual complaints  Pain is controlled with current medications.   Patient is urinating without difficulty.  She is ambulating well.     Objective  Vital signs: BP 107/71 (BP Location: Left Arm)   Pulse 65   Temp 98.6 F (37 C) (Oral)   Resp 18   Ht 5\' 7"  (1.702 m)   Wt 86.2 kg   LMP 08/12/2018 (Exact Date)   SpO2 98% Comment: Room Air  Breastfeeding Unknown   BMI 29.76 kg/m   Physical Exam: Gen: NAD Fundus Fundal Tone: Firm  Lochia Amount: Small  Perineum Appearance: Intact     Data Review Labs: CBC Latest Ref Rng & Units 05/12/2019 05/11/2019 04/26/2019  WBC 4.0 - 10.5 K/uL 14.9(H) 9.5 7.0  Hemoglobin 12.0 - 15.0 g/dL 04/28/2019 45.4 09.8  Hematocrit 36.0 - 46.0 % 40.8 42.0 39.0  Platelets 150 - 400 K/uL 144(L) 190 206   O NEG  Assessment/Plan  Active Problems:   Pregnancy    Plan for discharge today.   Discharge Instructions: Per After Visit Summary. Activity: Advance as tolerated. Pelvic rest for 6 weeks.  Also refer to After Visit Summary Diet: Regular Medications: Allergies as of 05/13/2019   No Known Allergies     Medication List    TAKE these medications   docusate sodium 100 MG capsule Commonly known as: COLACE Take 1 capsule (100 mg total) by mouth 2 (two) times daily.   ibuprofen 600 MG tablet Commonly known as: ADVIL Take 1 tablet (600 mg total) by mouth every 6 (six) hours.   prenatal multivitamin Tabs tablet Take 1 tablet by mouth daily at 12 noon.      Outpatient follow up: 6 wks postpartum with 07/11/2019, CNM   Postpartum contraception: Husband getting  Vasectomy  Discharged Condition: good  Discharged to: home  Newborn Data: Disposition:home with mother  Apgars: APGAR (1 MIN): 8   APGAR (5 MINS): 9   APGAR (10 MINS):    Baby Feeding: Breast    Doreene Burke, CNM    Doreene Burke, CNM 05/13/2019 8:29 AM

## 2019-05-13 NOTE — Telephone Encounter (Signed)
mychart message sent to patient

## 2019-05-13 NOTE — Lactation Note (Signed)
This note was copied from a baby's chart. Lactation Consultation Note  Patient Name: Jasmine Edwards [id number] Date: 05/13/2019 Reason for consult: Follow-up assessment   Maternal Data  right nipple sl tender with latch, but decreases with shaping of breast and flanging baby's lips  Feeding Feeding Type: Breast Fed Audible swallows heard LATCH Score Latch: Grasps breast easily, tongue down, lips flanged, rhythmical sucking.  Audible Swallowing: Spontaneous and intermittent  Type of Nipple: Everted at rest and after stimulation  Comfort (Breast/Nipple): Filling, red/small blisters or bruises, mild/mod discomfort(right breast)  Hold (Positioning): No assistance needed to correctly position infant at breast.  LATCH Score: 9  Interventions Interventions: Comfort gels, with instruction in use  Lactation Tools Discussed/Used     Consult Status Consult Status: Complete Date: 05/13/19 Follow-up type: In-patient    [name] 05/13/2019, 3:53 PM

## 2019-05-14 LAB — RHOGAM INJECTION: Unit division: 0

## 2019-05-18 ENCOUNTER — Encounter: Payer: Medicaid Other | Admitting: Certified Nurse Midwife

## 2019-06-20 ENCOUNTER — Encounter: Payer: BC Managed Care – PPO | Admitting: Certified Nurse Midwife

## 2019-06-28 ENCOUNTER — Ambulatory Visit (INDEPENDENT_AMBULATORY_CARE_PROVIDER_SITE_OTHER): Payer: BC Managed Care – PPO | Admitting: Certified Nurse Midwife

## 2019-06-28 ENCOUNTER — Other Ambulatory Visit: Payer: Self-pay

## 2019-06-28 ENCOUNTER — Encounter: Payer: Self-pay | Admitting: Certified Nurse Midwife

## 2019-06-28 NOTE — Patient Instructions (Signed)

## 2019-06-28 NOTE — Progress Notes (Signed)
Subjective:    Jasmine Edwards is a 29 y.o. [phone] Caucasian female who presents for a postpartum visit. She is 6 weeks postpartum following a spontaneous vaginal delivery at 38.3 gestational weeks. Anesthesia: epidural. I have fully reviewed the prenatal and intrapartum course. Postpartum course has been WNL. Baby's course has been WNL. Baby is feeding by breast. Bleeding no bleeding. Bowel function is normal. Bladder function is normal. Patient is not sexually active. Contraception method is vasectomy. Postpartum depression screening: mild. Score 6.  Last pap 08/08/16 and was negative.  The following portions of the patient's history were reviewed and updated as appropriate: allergies, current medications, past medical history, past surgical history and problem list.  Review of Systems Pertinent items are noted in HPI.   Vitals:   06/28/19 0921  BP: 102/65  Pulse: 74  Weight: 167 lb 6 oz (75.9 kg)  Height: 5\' 7"  (1.702 m)   No LMP recorded. (Menstrual status: Lactating).  Objective:   General:  alert, cooperative and no distress   Breasts:  deferred, no complaints  Lungs: clear to auscultation bilaterally  Heart:  regular rate and rhythm  Abdomen: soft, nontender   Vulva: normal  Vagina: normal vagina  Cervix:  closed  Corpus: Well-involuted  Adnexa:  Non-palpable  Rectal Exam: no hemorrhoids        Assessment:   Postpartum exam 6 wks s/p SVD Breatfeeding Depression screening Contraception counseling   Plan:  : vasectomy Follow up in: 6 months for annual or earlier if needed  , CNM

## 2019-08-04 ENCOUNTER — Telehealth: Payer: Self-pay

## 2019-08-04 NOTE — Telephone Encounter (Signed)
Letter done

## 2019-08-04 NOTE — Telephone Encounter (Signed)
mychart message sent

## 2019-08-04 NOTE — Telephone Encounter (Signed)
Pt needs back to work note.   Return day-08/04/19  Fax number- [phone]  Thanks.

## 2019-10-30 IMAGING — US TRANSVAGINAL OB ULTRASOUND
1 series · 14 of 28 positions shown · non-contrast
Comparison: None.

CLINICAL DATA: Pregnant patient with vaginal bleeding since
yesterday. Quantitative HCG [DATE].

EXAM:
OBSTETRIC <14 WK US AND TRANSVAGINAL OB US
TECHNIQUE: Both transabdominal and transvaginal ultrasound examinations were
performed for complete evaluation of the gestation as well as the
maternal uterus, adnexal regions, and pelvic cul-de-sac.
Transvaginal technique was performed to assess early pregnancy.

[Series 1: transvaginal ob ultrasound · 14 of 118 slices shown]
[im 5/118]
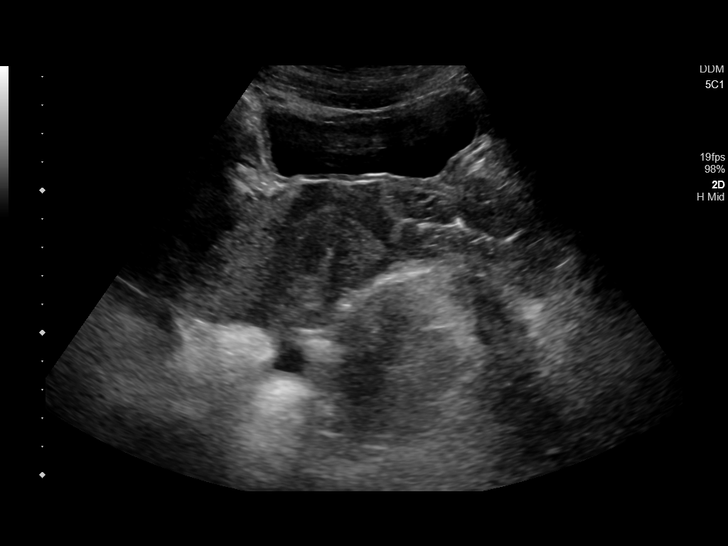
[im 14/118]
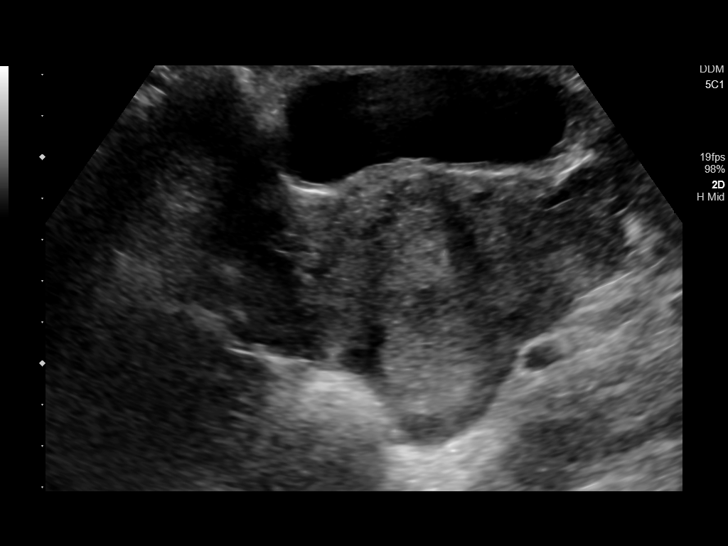
[im 22/118]
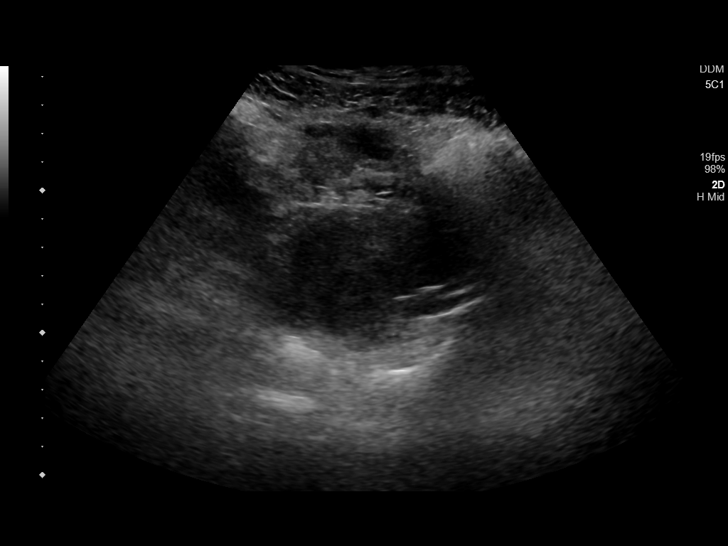
[im 31/118]
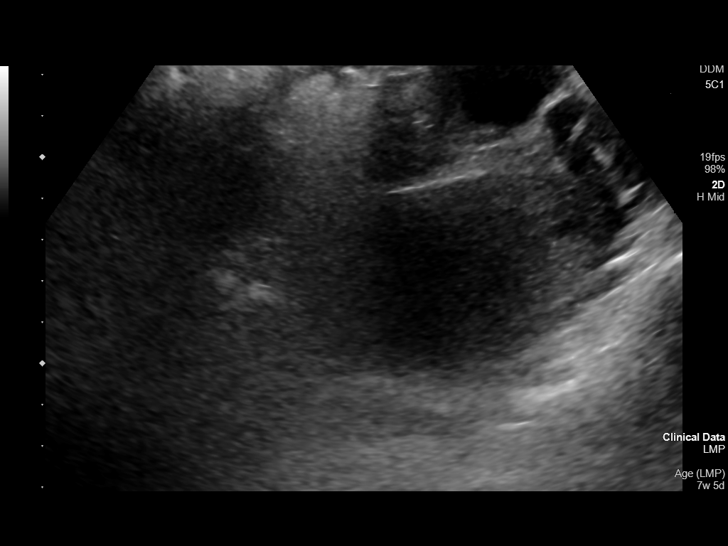
[im 40/118]
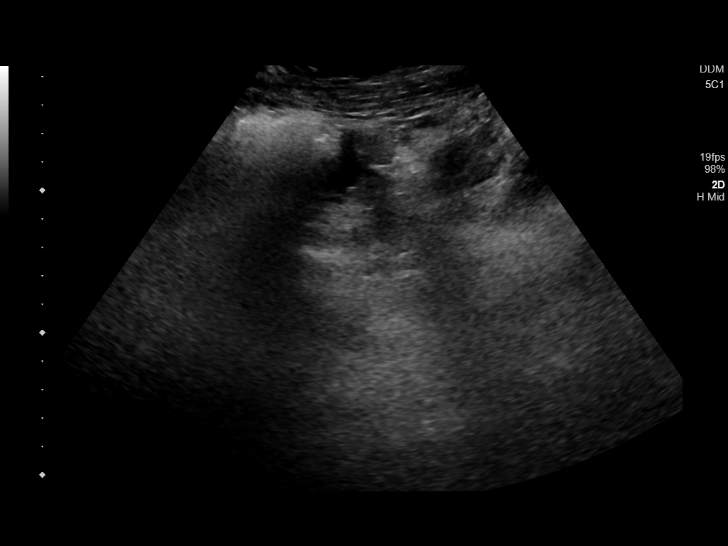
[im 48/118]
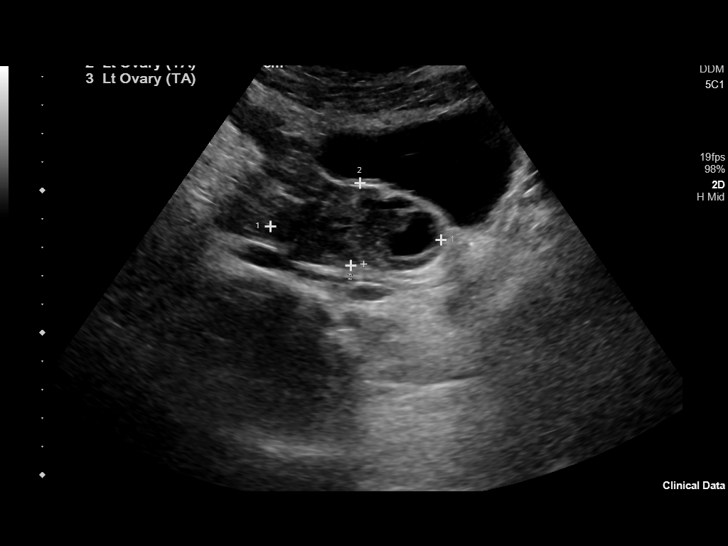
[im 57/118]
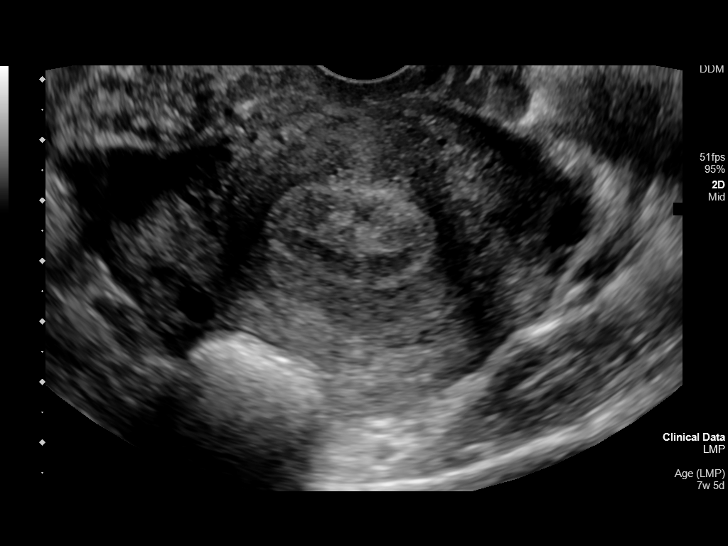
[im 66/118]
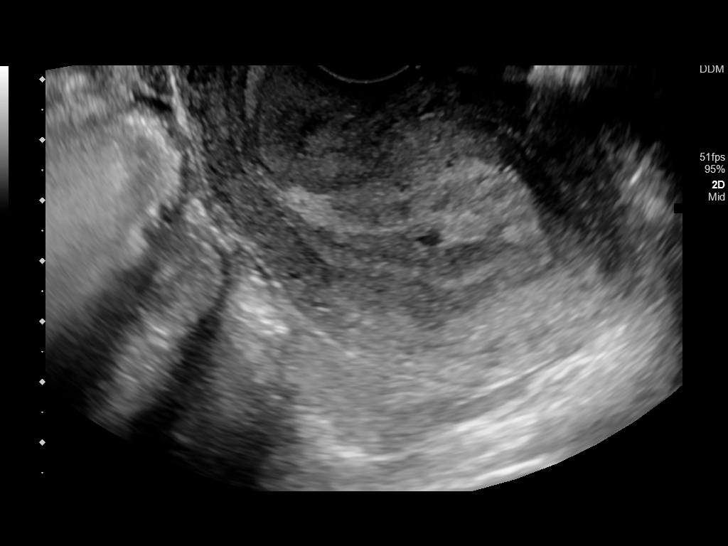
[im 74/118]
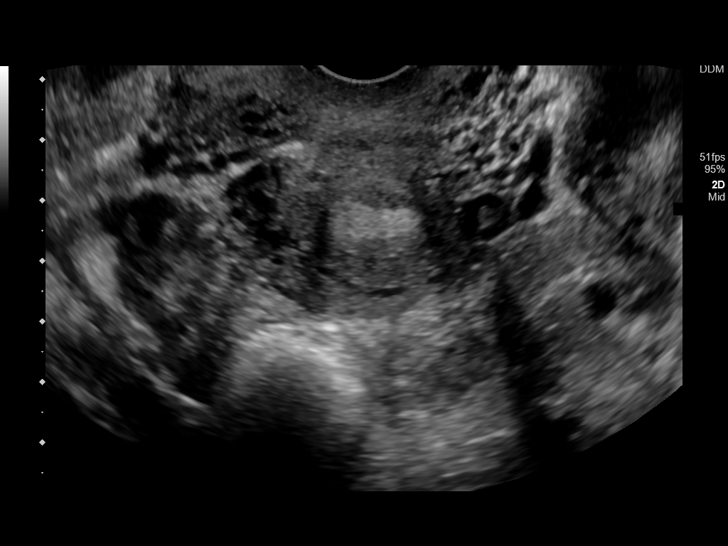
[im 83/118]
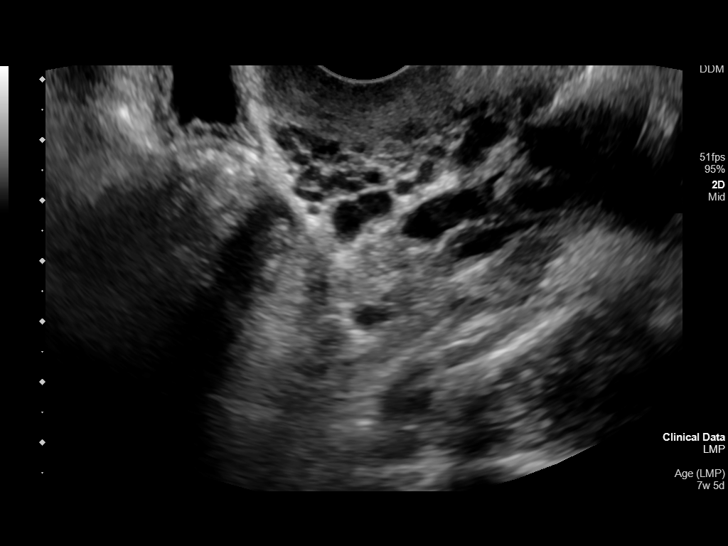
[im 92/118]
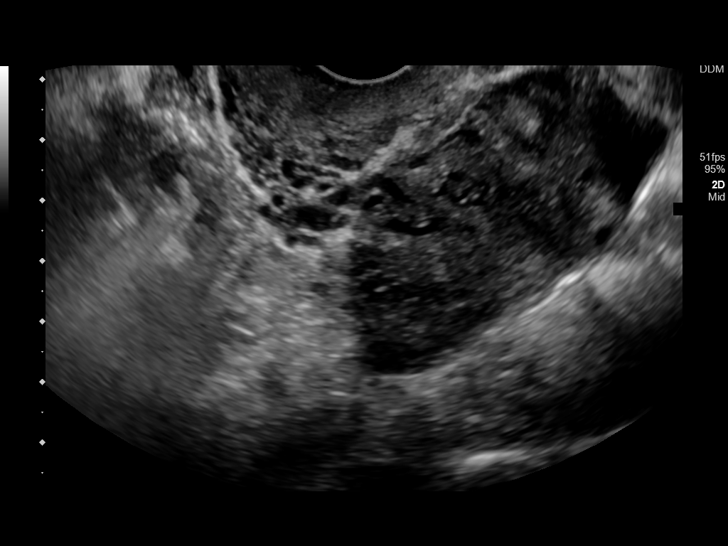
[im 100/118]
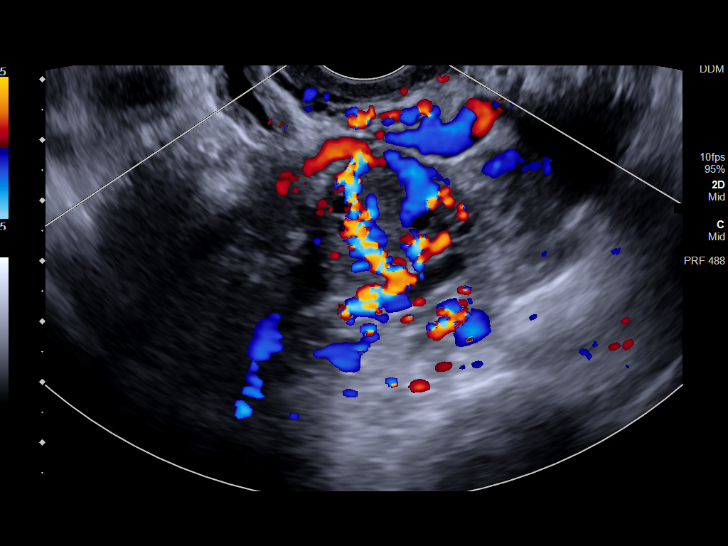
[im 109/118]
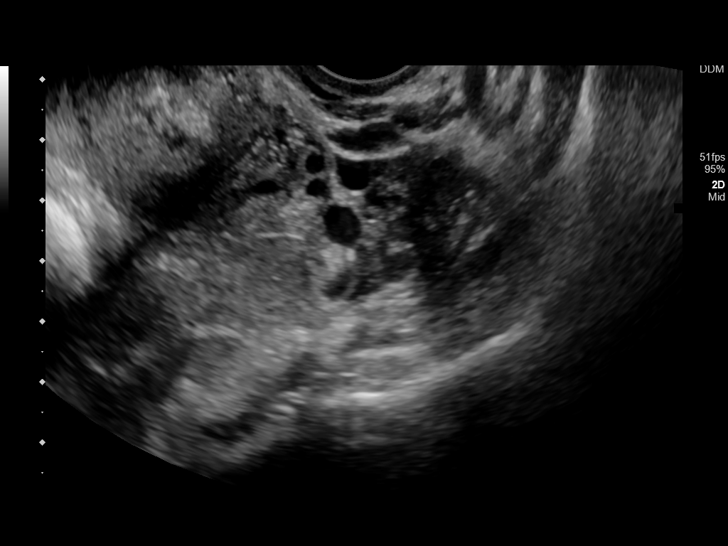
[im 118/118]
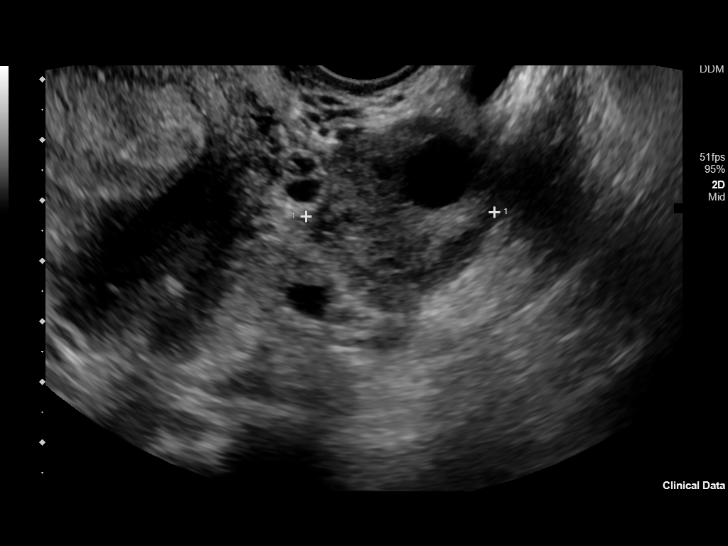

[14 of 28 positions shown; findings below may reference images not displayed]

FINDINGS: Intrauterine gestational sac: Not visualized. Endometrial stripe
thickness is 1.1 cm

Yolk sac:  Not applicable.

Embryo:  Not applicable.

Cardiac Activity: Not applicable.

Subchorionic hemorrhage:  Not applicable.

Maternal uterus/adnexae: Appear normal.
IMPRESSION: No ultrasound evidence of pregnancy is identified could be due to
early gestational age or abortion in progress given the patient's
vaginal bleeding. Recommend follow-up serial quantitative HCG.

## 2020-01-02 ENCOUNTER — Encounter: Payer: BC Managed Care – PPO | Admitting: Certified Nurse Midwife

## 2020-03-08 ENCOUNTER — Telehealth: Payer: Self-pay

## 2020-03-08 NOTE — Telephone Encounter (Signed)
mychart message sent to patient

## 2020-06-27 ENCOUNTER — Telehealth: Payer: Medicaid Other | Admitting: Family

## 2020-06-27 ENCOUNTER — Encounter: Payer: Self-pay | Admitting: Family

## 2020-06-27 ENCOUNTER — Telehealth: Payer: Self-pay | Admitting: Certified Nurse Midwife

## 2020-06-27 DIAGNOSIS — N61 Mastitis without abscess: Secondary | ICD-10-CM

## 2020-06-27 MED ORDER — CEPHALEXIN 500 MG PO CAPS: 500.0000 mg | ORAL_CAPSULE | Freq: Four times a day (QID) | ORAL | 0 refills | Status: AC

## 2020-06-27 NOTE — Progress Notes (Signed)
   Virtual Visit  Note Due to COVID-19 pandemic this visit was conducted virtually. This visit type was conducted due to national recommendations for restrictions regarding the COVID-19 Pandemic (e.g. social distancing, sheltering in place) in an effort to limit this patient's exposure and mitigate transmission in our community. All issues noted in this document were discussed and addressed.  A physical exam was not performed with this format.  I connected with Jasmine Edwards on 06/27/20 at 5:09 pm  by video and verified that I am speaking with the correct person using two identifiers. Jasmine Edwards is currently located at home and kids is currently with her during visit. The provider, Jannifer Rodney, FNP is located in their office at time of visit.  I discussed the limitations, risks, security and privacy concerns of performing an evaluation and management service by video  and the availability of in person appointments. I also discussed with the patient that there may be a patient responsible charge related to this service. The patient expressed understanding and agreed to proceed.   History and Present Illness:  HPI  Pt presents with right breast swelling, tenderness, redness, warmth, and fever. She reports her fever was 100.5 F. She is taking ibuprofen and tylenol with mild relief. She is complaining of chills and body aches.   She reports she has has mastitis in the past and this feels very similar. She is currently breastfeeding.  She reports her pain is 7 out 10.   Review of Systems  Skin: Positive for rash.  All other systems reviewed and are negative.    Observations/Objective: No SOB or distress noted   Assessment and Plan: 1. Mastitis Continue Tylenol and Motrin as needed Warm compresses Continue to breast feed Follow up with GYN or PCO if symptoms worsen or do not improve in next 24-48 hours  - cephALEXin (KEFLEX) 500 MG capsule; Take 1 capsule (500 mg  total) by mouth 4 (four) times daily for 10 days.  Dispense: 40 capsule; Refill: 0     I discussed the assessment and treatment plan with the patient. The patient was provided an opportunity to ask questions and all were answered. The patient agreed with the plan and demonstrated an understanding of the instructions.   The patient was advised to call back or seek an in-person evaluation if the symptoms worsen or if the condition fails to improve as anticipated.  The above assessment and management plan was discussed with the patient. The patient verbalized understanding of and has agreed to the management plan. Patient is aware to call the clinic if symptoms persist or worsen. Patient is aware when to return to the clinic for a follow-up visit. Patient educated on when it is appropriate to go to the emergency department.   Time call ended:  5:20 pm   I provided 11 minutes of   face-to-face time during this encounter.    Jannifer Rodney, FNP

## 2020-06-27 NOTE — Telephone Encounter (Signed)
Patient called in and states as of yesterday, she has developed mastitis.  Patient states she is running a fever of 100.5 and her breast is really red and painful.  Patient states she has tried nursing, pumping, massaging, and running warm water over her breasts.  Patient states nothing is helping and is wondering she Jasmine Edwards would call in an antibiotic.  Patient states she just started a job and can't come in any time soon.  Patient confirmed [facility] in [place] and confirmed call back number [phone].  Please advise.

## 2020-06-28 ENCOUNTER — Other Ambulatory Visit: Payer: Self-pay | Admitting: Certified Nurse Midwife

## 2020-06-28 NOTE — Telephone Encounter (Signed)
Pt had a video visit with FNP. She started the Keflex.  Encouraged her to contact office if sx do not improve. Pt voiced understanding.

## 2020-06-28 NOTE — Telephone Encounter (Signed)
This pt called in about having mastitis. I went to put in orders and it looks like keflex was ordered for her yesterday by another provider. Can you verify this. I am not going to put in for dicloxacillin if she has an order for keflex, they are both recommend lines of treatment for mastitis.   If she is not on the keflex then please order the dicloxacillin 500 mg 4 times a day for 10 days.   Thanks,  Pattricia Boss

## 2020-09-03 ENCOUNTER — Encounter: Payer: Medicaid Other | Admitting: Certified Nurse Midwife

## 2020-09-19 ENCOUNTER — Encounter: Payer: Self-pay | Admitting: Certified Nurse Midwife

## 2020-10-30 ENCOUNTER — Encounter: Payer: Self-pay | Admitting: Certified Nurse Midwife

## 2020-10-30 ENCOUNTER — Other Ambulatory Visit: Payer: Self-pay

## 2020-10-30 ENCOUNTER — Ambulatory Visit (INDEPENDENT_AMBULATORY_CARE_PROVIDER_SITE_OTHER): Payer: BC Managed Care – PPO | Admitting: Certified Nurse Midwife

## 2020-10-30 VITALS — BP 125/76 | HR 69 | Ht 67.0 in | Wt 143.4 lb

## 2020-10-30 DIAGNOSIS — N63 Unspecified lump in unspecified breast: Secondary | ICD-10-CM | POA: Diagnosis not present

## 2020-10-30 NOTE — Progress Notes (Signed)
GYN ENCOUNTER NOTE  Subjective:       Jasmine Edwards is a 30 y.o. [id number] female is here for gynecologic evaluation of the following issues:  1. Pt states she is weaning from breastfeeding and notice a lump on her right breat a few days ago. She denies redness, tenderness , fever or signs of mastitis. It is the breast that she had mastitis in prevously. She also noticed a bruise on her breast.     Gynecologic History Patient's last menstrual period was 10/29/2020 (exact date). Contraception: vasectomy Last Pap: 08/08/2016. Results were: normal Last mammogram:n/a  Obstetric History OB History  Gravida Para Term Preterm AB Living  4 3 3   1 3   SAB IAB Ectopic Multiple Live Births  1     0 3    # Outcome Date GA Lbr Len/2nd Weight Sex Delivery Anes PTL Lv  4 Term 05/12/19 [redacted]w[redacted]d / 00:06 7 lb 7.2 oz (3.38 kg) F Vag-Spont EPI  LIV  3 SAB 06/03/18 [redacted]w[redacted]d         2 Term 04/20/17 [redacted]w[redacted]d / 00:23 7 lb 3 oz (3.26 kg) F Vag-Spont EPI  LIV  1 Term 10/26/13 [redacted]w[redacted]d  7 lb 7 oz (3.374 kg) M Vag-Spont  Y LIV    Obstetric Comments  G1 - GDM (diet controlled). PTL at 28 weeks, arrested. IOL at 40 weeks.     Past Medical History:  Diagnosis Date   Depression    Diabetes, gestational    history    Past Surgical History:  Procedure Laterality Date   FOOT SURGERY      Current Outpatient Medications on File Prior to Visit  Medication Sig Dispense Refill   ibuprofen (ADVIL) 600 MG tablet Take 1 tablet (600 mg total) by mouth every 6 (six) hours. (Patient not taking: Reported on 10/30/2020) 30 tablet 0   Prenatal Vit-Fe Fumarate-FA (PRENATAL MULTIVITAMIN) TABS tablet Take 1 tablet by mouth daily at 12 noon.     No current facility-administered medications on file prior to visit.    No Known Allergies  Social History   Socioeconomic History   Marital status: Married    Spouse name: 12/30/2020   Number of children: Not on file   Years of education: Not on file   Highest education level: Not  on file  Occupational History   Not on file  Tobacco Use   Smoking status: Former   Smokeless tobacco: Never  Vaping Use   Vaping Use: Never used  Substance and Sexual Activity   Alcohol use: No   Drug use: No   Sexual activity: Yes    Partners: Male    Birth control/protection: Surgical    Comment: husband Vasectomy  Other Topics Concern   Not on file  Social History Narrative   Not on file   Social Determinants of Health   Financial Resource Strain: Not on file  Food Insecurity: Not on file  Transportation Needs: Not on file  Physical Activity: Not on file  Stress: Not on file  Social Connections: Not on file  Intimate Partner Violence: Not on file    Family History  Problem Relation Age of Onset   Diabetes Maternal Aunt    Hypertension Mother    Migraines Mother    Rashes / Skin problems Mother        Eczema    The following portions of the patient's history were reviewed and updated as appropriate: allergies, current medications, past family history, past medical  history, past social history, past surgical history and problem list.  Review of Systems Review of Systems - Negative except as mentioned in HPI Review of Systems - General ROS: negative for - chills, fatigue, fever, hot flashes, malaise or night sweats Hematological and Lymphatic ROS: negative for - bleeding problems or swollen lymph nodes Gastrointestinal ROS: negative for - abdominal pain, blood in stools, change in bowel habits and nausea/vomiting Musculoskeletal ROS: negative for - joint pain, muscle pain or muscular weakness Genito-Urinary ROS: negative for - change in menstrual cycle, dysmenorrhea, dyspareunia, dysuria, genital discharge, genital ulcers, hematuria, incontinence, irregular/heavy menses, nocturia or pelvic pain  Objective:   BP 125/76   Pulse 69   Ht 5\' 7"  (1.702 m)   Wt 143 lb 6.4 oz (65 kg)   LMP 10/29/2020 (Exact Date)   Breastfeeding Yes   BMI 22.46 kg/m   CONSTITUTIONAL: Well-developed, well-nourished female in no acute distress.  HENT:  Normocephalic, atraumatic.  NECK: Normal range of motion, supple, no masses.  Normal thyroid.  SKIN: Skin is warm and dry. No rash noted. Not diaphoretic. No erythema. No pallor. NEUROLGIC: Alert and oriented to person, place, and time. PSYCHIATRIC: Normal mood and affect. Normal behavior. Normal judgment and thought content. CARDIOVASCULAR:Not Examined RESPIRATORY: Not Examined BREASTS: Breasts: breasts appear normal, no suspicious masses, no skin or nipple changes or axillary nodes, left breast normal without mass, skin or nipple changes or axillary nodes. Right breast healing bruise noted at 3 o clock, small pea size lump felt 11 o clock 4-5 cm from nipple, mobile, non tender. Most likely block duct vs scar tissue from previous mastitis infections.  ABDOMEN: Soft, non distended; Non tender.  No Organomegaly. PELVIC:not indicated MUSCULOSKELETAL: Normal range of motion. No tenderness.  No cyanosis, clubbing, or edema.     Assessment:   Breast lump Blocked duct    Plan:   Reassurance given to pt. Given the location of bruise , likely occurred due to daughter head bumping her chest . Encouraged warm compress and message of area to decrease risk of mastitis. Red flag symptoms reviewed. Pt to call if fever, redness, body aches occur for antibiotics. She verbalizes and agrees. Follow up prn.   12/29/2020, CNM

## 2020-12-05 ENCOUNTER — Encounter: Payer: Medicaid Other | Admitting: Certified Nurse Midwife

## 2020-12-25 ENCOUNTER — Encounter: Payer: Medicaid Other | Admitting: Certified Nurse Midwife

## 2020-12-25 ENCOUNTER — Encounter: Payer: Self-pay | Admitting: Certified Nurse Midwife

## 2021-09-05 ENCOUNTER — Other Ambulatory Visit: Payer: Self-pay | Admitting: Adult Health

## 2021-09-05 DIAGNOSIS — N63 Unspecified lump in unspecified breast: Secondary | ICD-10-CM

## 2021-09-06 ENCOUNTER — Ambulatory Visit
Admission: RE | Admit: 2021-09-06 | Discharge: 2021-09-06 | Disposition: A | Discharge status: Home / Self Care | Payer: BC Managed Care – PPO | Source: Ambulatory Visit | Attending: Adult Health | Admitting: Adult Health

## 2021-09-06 DIAGNOSIS — N63 Unspecified lump in unspecified breast: Secondary | ICD-10-CM | POA: Diagnosis present

## 2022-01-06 ENCOUNTER — Emergency Department
Admission: EM | Admit: 2022-01-06 | Discharge: 2022-01-06 | Disposition: A | Discharge status: Home / Self Care | Payer: BC Managed Care – PPO | Attending: Emergency Medicine | Admitting: Emergency Medicine

## 2022-01-06 ENCOUNTER — Emergency Department: Payer: BC Managed Care – PPO

## 2022-01-06 ENCOUNTER — Encounter: Payer: Self-pay | Admitting: Emergency Medicine

## 2022-01-06 ENCOUNTER — Other Ambulatory Visit: Payer: Self-pay

## 2022-01-06 DIAGNOSIS — M25551 Pain in right hip: Secondary | ICD-10-CM | POA: Diagnosis not present

## 2022-01-06 DIAGNOSIS — S0081XA Abrasion of other part of head, initial encounter: Secondary | ICD-10-CM

## 2022-01-06 DIAGNOSIS — S0993XA Unspecified injury of face, initial encounter: Secondary | ICD-10-CM | POA: Diagnosis present

## 2022-01-06 DIAGNOSIS — Y92410 Unspecified street and highway as the place of occurrence of the external cause: Secondary | ICD-10-CM | POA: Diagnosis not present

## 2022-01-06 DIAGNOSIS — M79602 Pain in left arm: Secondary | ICD-10-CM | POA: Insufficient documentation

## 2022-01-06 DIAGNOSIS — M79632 Pain in left forearm: Secondary | ICD-10-CM

## 2022-01-06 LAB — POC URINE PREG, ED: Preg Test, Ur: NEGATIVE

## 2022-01-06 MED ORDER — NEOSPORIN PLUS PAIN RELIEF MS 3.5-10000-10 EX CREA: TOPICAL_CREAM | Freq: Two times a day (BID) | CUTANEOUS | 0 refills | Status: DC
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
## 2022-01-06 NOTE — Discharge Instructions (Addendum)
If you prefer, you may use silver sulfadiazine on your face however it is not recommended as it can cause severe burning if it comes in contact with your eyes.
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
## 2022-01-10 ENCOUNTER — Encounter: Payer: Self-pay | Admitting: Certified Nurse Midwife

## 2022-01-14 ENCOUNTER — Encounter: Payer: Self-pay | Admitting: Certified Nurse Midwife

## 2022-02-07 ENCOUNTER — Telehealth: Payer: BC Managed Care – PPO | Admitting: Emergency Medicine

## 2022-02-07 DIAGNOSIS — K047 Periapical abscess without sinus: Secondary | ICD-10-CM | POA: Diagnosis not present

## 2022-02-07 MED ORDER — PENICILLIN V POTASSIUM 500 MG PO TABS: 500.0000 mg | ORAL_TABLET | Freq: Three times a day (TID) | ORAL | 0 refills | Status: AC

## 2022-02-07 NOTE — Progress Notes (Signed)
E-Visit for Dental Pain  We are sorry that you are not feeling well.  Here is how we plan to help!  Based on what you have shared with me in the questionnaire, it sounds like you may have a dental infection. Please see your dentist next week as scheduled. I have prescribed:   PCN 500mg  3 times per day for 10 days  It is imperative that you see a dentist within 10 days of this eVisit to determine the cause of the dental pain and be sure it is adequately treated  A toothache or tooth pain is caused when the nerve in the root of a tooth or surrounding a tooth is irritated. Dental (tooth) infection, decay, injury, or loss of a tooth are the most common causes of dental pain. Pain may also occur after an extraction (tooth is pulled out). Pain sometimes originates from other areas and radiates to the jaw, thus appearing to be tooth pain.Bacteria growing inside your mouth can contribute to gum disease and dental decay, both of which can cause pain. A toothache occurs from inflammation of the central portion of the tooth called pulp. The pulp contains nerve endings that are very sensitive to pain. Inflammation to the pulp or pulpitis may be caused by dental cavities, trauma, and infection.    HOME CARE:   For toothaches: Over-the-counter pain medications such as acetaminophen or ibuprofen may be used. Take these as directed on the package while you arrange for a dental appointment. Avoid very cold or hot foods, because they may make the pain worse. You may get relief from biting on a cotton ball soaked in oil of cloves. You can get oil of cloves at most drug stores.  For jaw pain:  Aspirin may be helpful for problems in the joint of the jaw in adults. If pain happens every time you open your mouth widely, the temporomandibular joint (TMJ) may be the source of the pain. Yawning or taking a large bite of food may worsen the pain. An appointment with your doctor or dentist will help you find the cause.      GET HELP RIGHT AWAY IF:  You have a high fever or chills If you have had a recent head or face injury and develop headache, light headedness, nausea, vomiting, or other symptoms that concern you after an injury to your face or mouth, you could have a more serious injury in addition to your dental injury. A facial rash associated with a toothache: This condition may improve with medication. Contact your doctor for them to decide what is appropriate. Any jaw pain occurring with chest pain: Although jaw pain is most commonly caused by dental disease, it is sometimes referred pain from other areas. People with heart disease, especially people who have had stents placed, people with diabetes, or those who have had heart surgery may have jaw pain as a symptom of heart attack or angina. If your jaw or tooth pain is associated with lightheadedness, sweating, or shortness of breath, you should see a doctor as soon as possible. Trouble swallowing or excessive pain or bleeding from gums: If you have a history of a weakened immune system, diabetes, or steroid use, you may be more susceptible to infections. Infections can often be more severe and extensive or caused by unusual organisms. Dental and gum infections in people with these conditions may require more aggressive treatment. An abscess may need draining or IV antibiotics, for example.  MAKE SURE YOU   Understand  these instructions. Will watch your condition. Will get help right away if you are not doing well or get worse.  Thank you for choosing an e-visit.  Your e-visit answers were reviewed by a board certified advanced clinical practitioner to complete your personal care plan. Depending upon the condition, your plan could have included both over the counter or prescription medications.  Please review your pharmacy choice. Make sure the pharmacy is open so you can pick up prescription now. If there is a problem, you may contact your provider  through [employer] and have the prescription routed to another pharmacy.  Your safety is important to [place]. If you have drug allergies check your prescription carefully.   For the next 24 hours you can use MyChart to ask questions about today's visit, request a non-urgent call back, or ask for a work or school excuse. You will get an email in the next two days asking about your experience. I hope that your e-visit has been valuable and will speed your recovery.  I have spent 5 minutes in review of e-visit questionnaire, review and updating patient chart, medical decision making and response to patient.   Rica Mast, PhD, FNP-BC

## 2022-08-01 ENCOUNTER — Telehealth: Payer: BC Managed Care – PPO | Admitting: Family Medicine

## 2022-08-01 DIAGNOSIS — Z20818 Contact with and (suspected) exposure to other bacterial communicable diseases: Secondary | ICD-10-CM

## 2022-08-01 DIAGNOSIS — J020 Streptococcal pharyngitis: Secondary | ICD-10-CM

## 2022-08-01 MED ORDER — AMOXICILLIN 875 MG PO TABS: 875.0000 mg | ORAL_TABLET | Freq: Two times a day (BID) | ORAL | 0 refills | Status: AC

## 2022-08-01 NOTE — Progress Notes (Signed)

## 2022-10-06 ENCOUNTER — Telehealth: Payer: BC Managed Care – PPO | Admitting: Physician Assistant

## 2022-10-06 DIAGNOSIS — K047 Periapical abscess without sinus: Secondary | ICD-10-CM | POA: Diagnosis not present

## 2022-10-06 MED ORDER — PENICILLIN V POTASSIUM 500 MG PO TABS: 500.0000 mg | ORAL_TABLET | Freq: Three times a day (TID) | ORAL | 0 refills | Status: AC

## 2022-10-06 NOTE — Progress Notes (Signed)
E-Visit for Dental Pain  We are sorry that you are not feeling well.  Here is how we plan to help!  Based on what you have shared with me in the questionnaire, it sounds like you have a dental infection.  Pen VK 500mg 3 times a day for 7 days  It is imperative that you see a dentist within 10 days of this eVisit to determine the cause of the dental pain and be sure it is adequately treated  A toothache or tooth pain is caused when the nerve in the root of a tooth or surrounding a tooth is irritated. Dental (tooth) infection, decay, injury, or loss of a tooth are the most common causes of dental pain. Pain may also occur after an extraction (tooth is pulled out). Pain sometimes originates from other areas and radiates to the jaw, thus appearing to be tooth pain.Bacteria growing inside your mouth can contribute to gum disease and dental decay, both of which can cause pain. A toothache occurs from inflammation of the central portion of the tooth called pulp. The pulp contains nerve endings that are very sensitive to pain. Inflammation to the pulp or pulpitis may be caused by dental cavities, trauma, and infection.    HOME CARE:   For toothaches: Over-the-counter pain medications such as acetaminophen or ibuprofen may be used. Take these as directed on the package while you arrange for a dental appointment. Avoid very cold or hot foods, because they may make the pain worse. You may get relief from biting on a cotton ball soaked in oil of cloves. You can get oil of cloves at most drug stores.  For jaw pain:  Aspirin may be helpful for problems in the joint of the jaw in adults. If pain happens every time you open your mouth widely, the temporomandibular joint (TMJ) may be the source of the pain. Yawning or taking a large bite of food may worsen the pain. An appointment with your doctor or dentist will help you find the cause.     GET HELP RIGHT AWAY IF:  You have a high fever or chills If you  have had a recent head or face injury and develop headache, light headedness, nausea, vomiting, or other symptoms that concern you after an injury to your face or mouth, you could have a more serious injury in addition to your dental injury. A facial rash associated with a toothache: This condition may improve with medication. Contact your doctor for them to decide what is appropriate. Any jaw pain occurring with chest pain: Although jaw pain is most commonly caused by dental disease, it is sometimes referred pain from other areas. People with heart disease, especially people who have had stents placed, people with diabetes, or those who have had heart surgery may have jaw pain as a symptom of heart attack or angina. If your jaw or tooth pain is associated with lightheadedness, sweating, or shortness of breath, you should see a doctor as soon as possible. Trouble swallowing or excessive pain or bleeding from gums: If you have a history of a weakened immune system, diabetes, or steroid use, you may be more susceptible to infections. Infections can often be more severe and extensive or caused by unusual organisms. Dental and gum infections in people with these conditions may require more aggressive treatment. An abscess may need draining or IV antibiotics, for example.  MAKE SURE YOU   Understand these instructions. Will watch your condition. Will get help right away if   you are not doing well or get worse.  Thank you for choosing an e-visit.  Your e-visit answers were reviewed by a board certified advanced clinical practitioner to complete your personal care plan. Depending upon the condition, your plan could have included both over the counter or prescription medications.  Please review your pharmacy choice. Make sure the pharmacy is open so you can pick up prescription now. If there is a problem, you may contact your provider through MyChart messaging and have the prescription routed to another  pharmacy.  Your safety is important to us. If you have drug allergies check your prescription carefully.   For the next 24 hours you can use MyChart to ask questions about today's visit, request a non-urgent call back, or ask for a work or school excuse. You will get an email in the next two days asking about your experience. I hope that your e-visit has been valuable and will speed your recovery.  I have spent 5 minutes in review of e-visit questionnaire, review and updating patient chart, medical decision making and response to patient.   Mikael Skoda M Menno Vanbergen, PA-C  

## 2022-10-06 NOTE — Addendum Note (Signed)
Addended by: Margaretann Loveless on: 10/06/2022 04:57 PM   Modules accepted: Level of Service

## 2023-01-08 IMAGING — US US BREAST*R* LIMITED INC AXILLA
1 series · 2 of 2 positions shown · non-contrast
Comparison: None Available.

CLINICAL DATA: 30-year-old female presenting for evaluation of a
tender lump in the upper outer right breast.

EXAM:
ULTRASOUND OF THE RIGHT BREAST

[Series 1: us breast*right* limited inc axilla · 0.06mm/px · 2 of 2 slices shown]
[im 1/2]
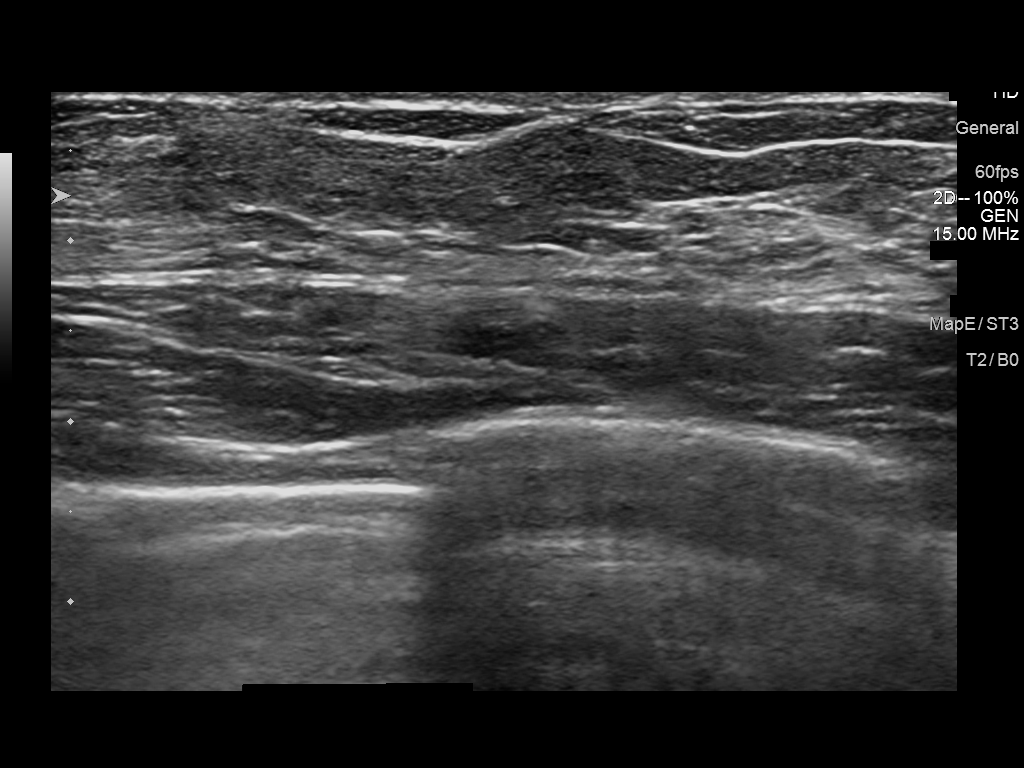
[im 2/2]
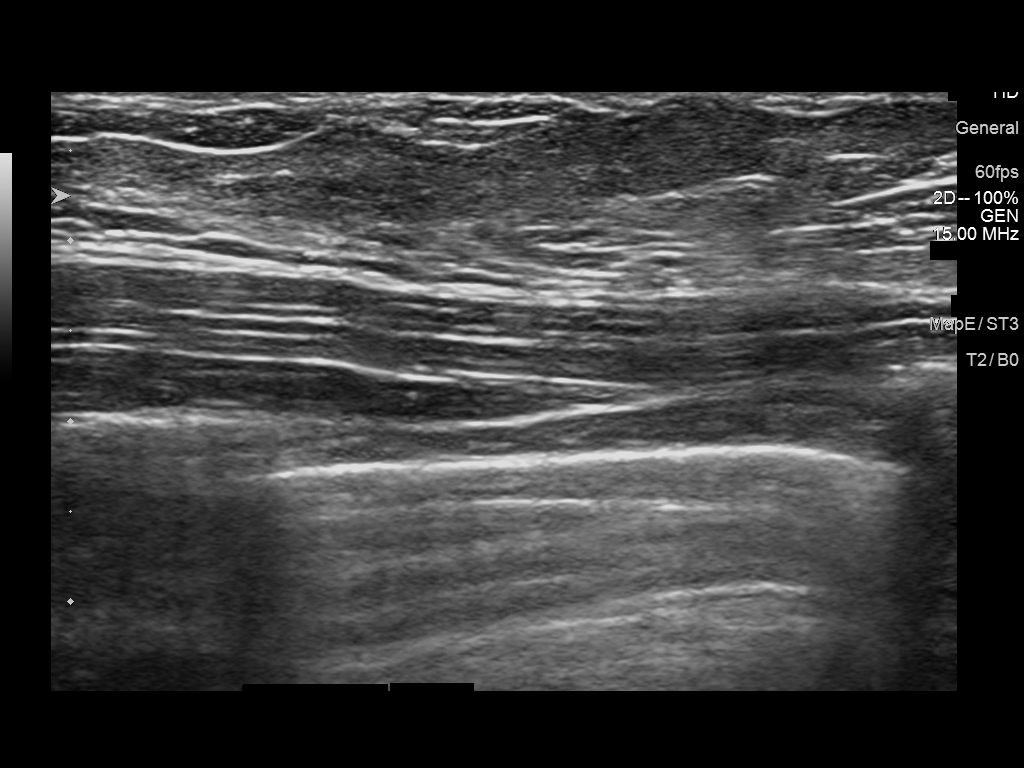

[2 of 2 positions shown; findings below may reference images not displayed]

FINDINGS: On physical exam, at the site of concern reported by the patient in
the upper-outer right breast I do not feel a discrete mass or focal
area of thickening.

Targeted ultrasound is performed at the palpable site of concern
reported by the patient at 10 o'clock 2 cm from the nipple
demonstrating no cystic or solid mass. There is only normal
fibroglandular tissue.
IMPRESSION: No sonographic abnormality at the palpable site of concern reported
by the patient in the right breast at 10 o'clock.

RECOMMENDATION:
1. Recommend any further workup of the palpable site be on a
clinical basis.

2. Continue routine annual clinical breast exams and begin screening
mammography at age 40.

I have discussed the findings and recommendations with the patient.
If applicable, a reminder letter will be sent to the patient
regarding the next appointment.

BI-RADS CATEGORY  1: Negative.

## 2023-09-17 ENCOUNTER — Telehealth: Admitting: Physician Assistant

## 2023-09-17 DIAGNOSIS — R110 Nausea: Secondary | ICD-10-CM

## 2023-09-17 DIAGNOSIS — S30860A Insect bite (nonvenomous) of lower back and pelvis, initial encounter: Secondary | ICD-10-CM

## 2023-09-17 DIAGNOSIS — W57XXXA Bitten or stung by nonvenomous insect and other nonvenomous arthropods, initial encounter: Secondary | ICD-10-CM | POA: Diagnosis not present

## 2023-09-17 MED ORDER — DOXYCYCLINE HYCLATE 100 MG PO TABS: 100.0000 mg | ORAL_TABLET | Freq: Two times a day (BID) | ORAL | 0 refills | Status: AC

## 2023-09-17 NOTE — Progress Notes (Signed)
 E-Visit for Tick Bite  Thank you for describing your tick bite, Here is how we plan to help! Based on the information that you shared with me it looks like you have A tick that bite that we will treat with a short course of doxycycline .  In most cases a tick bite is painless and does not itch.  Most tick bites in which the tick is quickly removed do not require prescriptions. Ticks can transmit several diseases if they are infected and remain attacked to your skin. Therefore the length that the tick was attached and any symptoms you have experienced after the bite are import to accurately develop your custom treatment plan. In most cases a single dose of doxycycline  may prevent the development of a more serious condition.  Based on your information I have Provided a home care guide for tick bites and  instructions on when to call for help. and Your symptoms indicate that you need a longer course of antibiotics and a follow up visit with a provider. I have sent doxycycline  100 mg twice a day for 21 days to the pharmacy that you selected. You will need to schedule a follow up visit with your provider. If you do not have a primary care provider you may use our telehealth physicians on the web at [facility]/  Which ticks  are associated with illness?  The Wood Tick (dog tick) is the size of a watermelon seed and can sometimes transmit [facility] spotted fever and [place]  tick fever.   The Deer Tick (black-legged tick) is between the size of a poppy seed (pin head) and an apple seed, and can sometimes transmit Lyme disease.  A brown to black tick with a white splotch on its back is likely a female Amblyomma americanum ([place] tick). This tick has been associated with [place] Tick Associated illness ( STARI)  Lyme disease has become the most common tick-borne illness in the [place] . The risk of Lyme disease following a recognized deer tick bite is estimated to be 1%.  The majority  of cases of Lyme disease start with a bull's eye rash at the site of the tick bite. The rash can occur days to weeks (typically 7-10 days) after a tick bite. Treatment with antibiotics is indicated if this rash appears. Flu-like symptoms may accompany the rash, including: fever, chills, headaches, muscle aches, and fatigue. Removing ticks promptly may prevent tick borne disease.  What can be used to prevent Tick Bites?  Insect repellant with at leas 20% DEET. Wearing long pants with sock and shoes. Avoiding tall grass and heavily wooded areas. Checking your skin after being outdoors. Shower with a washcloth after outdoor exposures.  HOME CARE ADVICE FOR TICK BITE  Wood Tick Removal:  Use a pair of tweezers and grasp the wood tick close to the skin (on its head). Pull the wood tick straight upward without twisting or crushing it. Maintain a steady pressure until it releases its grip.   If tweezers aren't available, use fingers, a loop of thread around the jaws, or a needle between the jaws for traction.  Note: covering the tick with petroleum jelly, nail polish or rubbing alcohol doesn't work. Neither does touching the tick with a hot or cold object. Tiny Deer Tick Removal:   Needs to be scraped off with a knife blade or credit card edge. Place tick in a sealed container (e.g. glass jar, zip lock plastic bag), in case your doctor wants to see it.  Tick's Head Removal:  If the wood tick's head breaks off in the skin, it must be removed. Clean the skin. Then use a sterile needle to uncover the head and lift it out or scrape it off.  If a very small piece of the head remains, the skin will eventually slough it off. Antibiotic Ointment:  Wash the wound and your hands with soap and water after removal to prevent catching any tick disease.  Apply an over the counter antibiotic ointment (e.g. bacitracin) to the bite once. Expected Course: Tick bites normally don't itch or hurt. That's why they often  go unnoticed. Call Your Doctor If:  You can't remove the tick or the tick's head Fever, a severe head ache, or rash occur in the next 2 weeks Bite begins to look infected Lyme's disease is common in your area You have not had a tetanus in the last 10 years Your current symptoms become worse    MAKE SURE YOU  Understand these instructions. Will watch your condition. Will get help right away if you are not doing well or get worse.    Thank you for choosing an e-visit.  Your e-visit answers were reviewed by a board certified advanced clinical practitioner to complete your personal care plan. Depending upon the condition, your plan could have included both over the counter or prescription medications.  Please review your pharmacy choice. Make sure the pharmacy is open so you can pick up prescription now. If there is a problem, you may contact your provider through [employer] and have the prescription routed to another pharmacy.  Your safety is important to us . If you have drug allergies check your prescription carefully.   For the next 24 hours you can use MyChart to ask questions about today's visit, request a non-urgent call back, or ask for a work or school excuse. You will get an email in the next two days asking about your experience. I hope that your e-visit has been valuable and will speed your recovery.    have provided 5 minutes of non face to face time during this encounter for chart review and documentation.

## 2024-03-23 ENCOUNTER — Other Ambulatory Visit (HOSPITAL_COMMUNITY)
Admission: RE | Admit: 2024-03-23 | Discharge: 2024-03-23 | Disposition: A | Discharge status: Home / Self Care | Source: Ambulatory Visit | Attending: Nurse Practitioner | Admitting: Nurse Practitioner

## 2024-03-23 ENCOUNTER — Encounter: Payer: Self-pay | Admitting: Nurse Practitioner

## 2024-03-23 ENCOUNTER — Ambulatory Visit (INDEPENDENT_AMBULATORY_CARE_PROVIDER_SITE_OTHER): Admitting: Nurse Practitioner

## 2024-03-23 VITALS — BP 110/82 | HR 71 | Temp 98.0°F | Ht 67.0 in | Wt 146.0 lb

## 2024-03-23 DIAGNOSIS — Z114 Encounter for screening for human immunodeficiency virus [HIV]: Secondary | ICD-10-CM | POA: Diagnosis not present

## 2024-03-23 DIAGNOSIS — Z124 Encounter for screening for malignant neoplasm of cervix: Secondary | ICD-10-CM | POA: Insufficient documentation

## 2024-03-23 DIAGNOSIS — N920 Excessive and frequent menstruation with regular cycle: Secondary | ICD-10-CM

## 2024-03-23 DIAGNOSIS — Z0000 Encounter for general adult medical examination without abnormal findings: Secondary | ICD-10-CM | POA: Insufficient documentation

## 2024-03-23 DIAGNOSIS — Z131 Encounter for screening for diabetes mellitus: Secondary | ICD-10-CM | POA: Diagnosis not present

## 2024-03-23 DIAGNOSIS — Z7689 Persons encountering health services in other specified circumstances: Secondary | ICD-10-CM | POA: Diagnosis not present

## 2024-03-23 DIAGNOSIS — Z13220 Encounter for screening for lipoid disorders: Secondary | ICD-10-CM | POA: Diagnosis not present

## 2024-03-23 DIAGNOSIS — Z1159 Encounter for screening for other viral diseases: Secondary | ICD-10-CM

## 2024-03-23 DIAGNOSIS — Z130 Encounter for screening for diseases of the blood and blood-forming organs and certain disorders involving the immune mechanism: Secondary | ICD-10-CM | POA: Diagnosis not present
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
## 2024-03-24 LAB — COMPREHENSIVE METABOLIC PANEL WITH GFR
AG Ratio: 1.5 (calc) (ref 1.0–2.5)
ALT: 18 U/L (ref 6–29)
AST: 23 U/L (ref 10–30)
Albumin: 4.3 g/dL (ref 3.6–5.1)
Alkaline phosphatase (APISO): 46 U/L (ref 31–125)
BUN: 21 mg/dL (ref 7–25)
CO2: 26 mmol/L (ref 20–32)
Calcium: 9.4 mg/dL (ref 8.6–10.2)
Chloride: 103 mmol/L (ref 98–110)
Creat: 0.94 mg/dL (ref 0.50–0.97)
Globulin: 2.8 g/dL (ref 1.9–3.7)
Glucose, Bld: 80 mg/dL (ref 65–99)
Potassium: 4.1 mmol/L (ref 3.5–5.3)
Sodium: 137 mmol/L (ref 135–146)
Total Bilirubin: 0.7 mg/dL (ref 0.2–1.2)
Total Protein: 7.1 g/dL (ref 6.1–8.1)
eGFR: 83 mL/min/1.73m2

## 2024-03-24 LAB — CBC WITH DIFFERENTIAL/PLATELET
Absolute Lymphocytes: 882 {cells}/uL (ref 850–3900)
Absolute Monocytes: 291 {cells}/uL (ref 200–950)
Basophils Absolute: 49 {cells}/uL (ref 0–200)
Basophils Relative: 1.2 %
Eosinophils Absolute: 62 {cells}/uL (ref 15–500)
Eosinophils Relative: 1.5 %
HCT: 39.9 % (ref 35.9–46.0)
Hemoglobin: 12.8 g/dL (ref 11.7–15.5)
MCH: 29 pg (ref 27.0–33.0)
MCHC: 32.1 g/dL (ref 31.6–35.4)
MCV: 90.3 fL (ref 81.4–101.7)
MPV: 9.3 fL (ref 7.5–12.5)
Monocytes Relative: 7.1 %
Neutro Abs: 2817 {cells}/uL (ref 1500–7800)
Neutrophils Relative %: 68.7 %
Platelets: 233 Thousand/uL (ref 140–400)
RBC: 4.42 Million/uL (ref 3.80–5.10)
RDW: 11.9 % (ref 11.0–15.0)
Total Lymphocyte: 21.5 %
WBC: 4.1 Thousand/uL (ref 3.8–10.8)

## 2024-03-24 LAB — HIV ANTIBODY (ROUTINE TESTING W REFLEX)
HIV 1&2 Ab, 4th Generation: NONREACTIVE
HIV FINAL INTERPRETATION: NEGATIVE

## 2024-03-24 LAB — HEMOGLOBIN A1C
Hgb A1c MFr Bld: 5.1 %
Mean Plasma Glucose: 100 mg/dL
eAG (mmol/L): 5.5 mmol/L

## 2024-03-24 LAB — LIPID PANEL
Cholesterol: 161 mg/dL
HDL: 64 mg/dL
LDL Cholesterol (Calc): 86 mg/dL
Non-HDL Cholesterol (Calc): 97 mg/dL
Total CHOL/HDL Ratio: 2.5 (calc)
Triglycerides: 38 mg/dL

## 2024-03-24 LAB — TSH: TSH: 1.15 m[IU]/L

## 2024-03-24 LAB — HEPATITIS C ANTIBODY: Hepatitis C Ab: NONREACTIVE

## 2024-03-25 ENCOUNTER — Ambulatory Visit: Payer: Self-pay | Admitting: Nurse Practitioner

## 2024-03-25 LAB — CYTOLOGY - PAP
Adequacy: ABSENT
Comment: NEGATIVE
Diagnosis: REACTIVE
High risk HPV: NEGATIVE
